# Patient Record
Sex: Male | Born: 1945 | Race: Black or African American | Hispanic: No | Marital: Married | Smoking: Never smoker
Health system: Southern US, Community
[De-identification: ages and names within clinical notes are randomized; demographics above are authoritative.]

## PROBLEM LIST (undated history)

## (undated) DIAGNOSIS — I10 Essential (primary) hypertension: Secondary | ICD-10-CM

## (undated) DIAGNOSIS — N200 Calculus of kidney: Secondary | ICD-10-CM

## (undated) DIAGNOSIS — H269 Unspecified cataract: Secondary | ICD-10-CM

## (undated) HISTORY — PX: JOINT REPLACEMENT: SHX530

## (undated) HISTORY — DX: Unspecified cataract: H26.9

## (undated) HISTORY — PX: HERNIA REPAIR: SHX51

## (undated) HISTORY — PX: KNEE SURGERY: SHX244

## (undated) HISTORY — PX: COLONOSCOPY: SHX174

## (undated) HISTORY — PX: SHOULDER OPEN ROTATOR CUFF REPAIR: SHX2407

---

## 1998-03-10 ENCOUNTER — Encounter: Admission: RE | Admit: 1998-03-10 | Discharge: 1998-03-10 | Payer: Self-pay | Admitting: Family Medicine

## 1999-11-02 ENCOUNTER — Emergency Department (HOSPITAL_COMMUNITY): Admission: EM | Admit: 1999-11-02 | Discharge: 1999-11-02 | Payer: Self-pay | Admitting: Emergency Medicine

## 1999-11-02 ENCOUNTER — Encounter: Payer: Self-pay | Admitting: Emergency Medicine

## 1999-11-04 ENCOUNTER — Emergency Department (HOSPITAL_COMMUNITY): Admission: EM | Admit: 1999-11-04 | Discharge: 1999-11-04 | Payer: Self-pay | Admitting: Emergency Medicine

## 2005-06-02 ENCOUNTER — Emergency Department (HOSPITAL_COMMUNITY): Admission: EM | Admit: 2005-06-02 | Discharge: 2005-06-02 | Payer: Self-pay | Admitting: Emergency Medicine

## 2006-06-06 ENCOUNTER — Ambulatory Visit (HOSPITAL_BASED_OUTPATIENT_CLINIC_OR_DEPARTMENT_OTHER): Admission: RE | Admit: 2006-06-06 | Discharge: 2006-06-07 | Payer: Self-pay | Admitting: Orthopaedic Surgery

## 2006-08-02 ENCOUNTER — Emergency Department (HOSPITAL_COMMUNITY): Admission: EM | Admit: 2006-08-02 | Discharge: 2006-08-02 | Payer: Self-pay | Admitting: Emergency Medicine

## 2008-03-08 ENCOUNTER — Emergency Department (HOSPITAL_COMMUNITY): Admission: EM | Admit: 2008-03-08 | Discharge: 2008-03-08 | Payer: Self-pay | Admitting: *Deleted

## 2008-03-08 ENCOUNTER — Emergency Department (HOSPITAL_COMMUNITY): Admission: EM | Admit: 2008-03-08 | Discharge: 2008-03-08 | Payer: Self-pay | Admitting: Emergency Medicine

## 2008-07-30 ENCOUNTER — Encounter (INDEPENDENT_AMBULATORY_CARE_PROVIDER_SITE_OTHER): Payer: Self-pay | Admitting: *Deleted

## 2008-11-17 ENCOUNTER — Ambulatory Visit: Payer: Self-pay | Admitting: Gastroenterology

## 2008-11-29 ENCOUNTER — Ambulatory Visit: Payer: Self-pay | Admitting: Gastroenterology
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
## 2011-02-06 LAB — URINALYSIS, ROUTINE W REFLEX MICROSCOPIC
Glucose, UA: NEGATIVE
Glucose, UA: NEGATIVE
Ketones, ur: NEGATIVE
Leukocytes, UA: NEGATIVE
Leukocytes, UA: NEGATIVE
Protein, ur: NEGATIVE
Protein, ur: NEGATIVE
Specific Gravity, Urine: 1.026
Urobilinogen, UA: 0.2
pH: 5.5

## 2011-02-06 LAB — POCT I-STAT, CHEM 8
BUN: 11
Calcium, Ion: 1.22
Hemoglobin: 15
Sodium: 140
TCO2: 27

## 2011-02-06 LAB — URINE MICROSCOPIC-ADD ON

## 2011-09-13 ENCOUNTER — Ambulatory Visit (INDEPENDENT_AMBULATORY_CARE_PROVIDER_SITE_OTHER): Payer: 59 | Admitting: Family Medicine

## 2011-09-13 ENCOUNTER — Ambulatory Visit: Payer: 59

## 2011-09-13 VITALS — BP 124/76 | HR 65 | Temp 98.1°F | Resp 16 | Ht 69.0 in | Wt 198.0 lb

## 2011-09-13 DIAGNOSIS — H6982 Other specified disorders of Eustachian tube, left ear: Secondary | ICD-10-CM

## 2011-09-13 DIAGNOSIS — M545 Low back pain, unspecified: Secondary | ICD-10-CM

## 2011-09-13 DIAGNOSIS — H6992 Unspecified Eustachian tube disorder, left ear: Secondary | ICD-10-CM

## 2011-09-13 DIAGNOSIS — N400 Enlarged prostate without lower urinary tract symptoms: Secondary | ICD-10-CM

## 2011-09-13 DIAGNOSIS — K625 Hemorrhage of anus and rectum: Secondary | ICD-10-CM

## 2011-09-13 DIAGNOSIS — Z0000 Encounter for general adult medical examination without abnormal findings: Secondary | ICD-10-CM

## 2011-09-13 LAB — POCT CBC
Lymph, poc: 2.4 (ref 0.6–3.4)
MCHC: 32 g/dL (ref 31.8–35.4)
MID (cbc): 0.4 (ref 0–0.9)
MPV: 8.7 fL (ref 0–99.8)
POC Granulocyte: 2.2 (ref 2–6.9)
POC LYMPH PERCENT: 47.8 %L (ref 10–50)
POC MID %: 8.7 %M (ref 0–12)
Platelet Count, POC: 287 10*3/uL (ref 142–424)
RDW, POC: 15.2 %

## 2011-09-13 LAB — IFOBT (OCCULT BLOOD): IFOBT: NEGATIVE

## 2011-09-13 MED ORDER — MELOXICAM 7.5 MG PO TABS: 7.5000 mg | ORAL_TABLET | Freq: Two times a day (BID) | ORAL | Status: AC

## 2011-09-13 MED ORDER — FLUTICASONE PROPIONATE 50 MCG/ACT NA SUSP: 2.0000 | Freq: Every day | NASAL | Status: DC

## 2011-09-13 MED ORDER — HYDROCORTISONE ACETATE 25 MG RE SUPP: 25.0000 mg | Freq: Two times a day (BID) | RECTAL | Status: AC

## 2011-09-13 MED ORDER — DOXAZOSIN MESYLATE 1 MG PO TABS: 1.0000 mg | ORAL_TABLET | Freq: Every day | ORAL | Status: DC

## 2011-09-13 NOTE — Progress Notes (Signed)
  Subjective:    Patient ID: Jeffrey Hull, male    DOB: [birth year], 66 y.o.   MRN: [id number]  HPI  Patient presents for CPE  LBP- complains of radiating pain to (B) buttocks and legs(R) > (L)          Occ (R) LE weakness  Internal hemorrhoids- Intermittent BRBPR and pain; using OTC Rx; Colonoscopy 2 years ago.  Nocturia one episode nightly; concerned about prostate as first cousin died of prostate cancer  Review of Systems SH/ smokeless tobacco  Otalgia (L) ear; "sinus" this time of year    Objective:   Physical Exam  Constitutional: He is oriented to person, place, and time. He appears well-developed.  HENT:  Head: Normocephalic and atraumatic.       (L) TM retracted  Eyes: EOM are normal. Pupils are equal, round, and reactive to light.  Neck: Normal range of motion. No thyromegaly present.  Cardiovascular: Normal rate, regular rhythm and normal heart sounds.   Pulmonary/Chest: Effort normal and breath sounds normal.  Abdominal: Soft. Bowel sounds are normal.       Umbilical hernia   Genitourinary:       2 + BPH Small palpable internal hemorrhoids   Musculoskeletal: Normal range of motion.  Neurological: He is alert and oriented to person, place, and time.  Skin: Skin is warm.      [facility] reading (PRIMARY) by  Dr. Hal Hope. L/S spine anterior spurring; degenerative changes L 5 - S1      Assessment & Plan:   1. Routine general medical examination at a health care facility  Comprehensive metabolic panel, Lipid panel, PSA, TSH  2. BRBPR (bright red blood per rectum)  POCT CBC, IFOBT POC (occult bld, rslt in office), hydrocortisone (ANUSOL-HC) 25 MG suppository  3. LBP (low back pain)  DG Lumbar Spine Complete, meloxicam (MOBIC) 7.5 MG tablet  4. ETD (eustachian tube dysfunction), left  fluticasone (FLONASE) 50 MCG/ACT nasal spray  5. BPH (benign prostatic hyperplasia)  doxazosin (CARDURA) 1 MG tablet    Anticipatory guidance Zostavax

## 2011-09-14 LAB — COMPREHENSIVE METABOLIC PANEL
Albumin: 4.8 g/dL (ref 3.5–5.2)
BUN: 8 mg/dL (ref 6–23)
Calcium: 9.9 mg/dL (ref 8.4–10.5)
Chloride: 106 mEq/L (ref 96–112)
Creat: 1.38 mg/dL — ABNORMAL HIGH (ref 0.50–1.35)
Glucose, Bld: 94 mg/dL (ref 70–99)
Potassium: 4.2 mEq/L (ref 3.5–5.3)

## 2011-09-14 LAB — LIPID PANEL
HDL: 49 mg/dL (ref >39)
Triglycerides: 114 mg/dL (ref <150)

## 2011-09-18 ENCOUNTER — Telehealth: Payer: Self-pay

## 2011-10-26 ENCOUNTER — Ambulatory Visit (INDEPENDENT_AMBULATORY_CARE_PROVIDER_SITE_OTHER): Payer: 59 | Admitting: Family Medicine

## 2011-10-26 VITALS — BP 129/81 | HR 71 | Temp 98.7°F | Resp 16 | Ht 69.4 in | Wt 203.4 lb

## 2011-10-26 DIAGNOSIS — K649 Unspecified hemorrhoids: Secondary | ICD-10-CM

## 2011-10-26 DIAGNOSIS — H11009 Unspecified pterygium of unspecified eye: Secondary | ICD-10-CM

## 2011-10-26 MED ORDER — HYDROCORTISONE 2.5 % RE CREA: 1.0000 "application " | TOPICAL_CREAM | Freq: Every day | RECTAL | Status: DC
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
## 2012-10-21 ENCOUNTER — Emergency Department (HOSPITAL_COMMUNITY)
Admission: EM | Admit: 2012-10-21 | Discharge: 2012-10-21 | Disposition: A | Discharge status: Home / Self Care | Payer: Medicare Other | Attending: Emergency Medicine | Admitting: Emergency Medicine

## 2012-10-21 ENCOUNTER — Encounter (HOSPITAL_COMMUNITY): Payer: Self-pay | Admitting: *Deleted

## 2012-10-21 DIAGNOSIS — Z87891 Personal history of nicotine dependence: Secondary | ICD-10-CM | POA: Insufficient documentation

## 2012-10-21 DIAGNOSIS — R110 Nausea: Secondary | ICD-10-CM | POA: Insufficient documentation

## 2012-10-21 DIAGNOSIS — N200 Calculus of kidney: Secondary | ICD-10-CM | POA: Insufficient documentation

## 2012-10-21 LAB — COMPREHENSIVE METABOLIC PANEL
AST: 33 U/L (ref 0–37)
Albumin: 3.8 g/dL (ref 3.5–5.2)
Calcium: 9 mg/dL (ref 8.4–10.5)
Chloride: 106 mEq/L (ref 96–112)
Creatinine, Ser: 1.48 mg/dL — ABNORMAL HIGH (ref 0.50–1.35)
Total Bilirubin: 0.2 mg/dL — ABNORMAL LOW (ref 0.3–1.2)

## 2012-10-21 LAB — URINE MICROSCOPIC-ADD ON

## 2012-10-21 LAB — URINALYSIS, ROUTINE W REFLEX MICROSCOPIC
Glucose, UA: NEGATIVE mg/dL
Specific Gravity, Urine: 1.019 (ref 1.005–1.030)

## 2012-10-21 LAB — CBC WITH DIFFERENTIAL/PLATELET
Basophils Absolute: 0 10*3/uL (ref 0.0–0.1)
Basophils Relative: 0 % (ref 0–1)
HCT: 41.7 % (ref 39.0–52.0)
MCHC: 33.3 g/dL (ref 30.0–36.0)
Monocytes Absolute: 0.4 10*3/uL (ref 0.1–1.0)
Neutro Abs: 1.7 10*3/uL (ref 1.7–7.7)
Neutrophils Relative %: 35 % — ABNORMAL LOW (ref 43–77)
Platelets: 212 10*3/uL (ref 150–400)
RDW: 14.7 % (ref 11.5–15.5)

## 2012-10-21 MED ORDER — SODIUM CHLORIDE 0.9 % IV BOLUS (SEPSIS)
1000.0000 mL | Freq: Once | INTRAVENOUS | Status: AC
  Administered 2012-10-21: 1000 mL via INTRAVENOUS

## 2012-10-21 MED ORDER — ONDANSETRON HCL 4 MG/2ML IJ SOLN
4.0000 mg | Freq: Once | INTRAMUSCULAR | Status: AC
  Administered 2012-10-21: 4 mg via INTRAVENOUS
  Filled 2012-10-21: qty 2

## 2012-10-21 MED ORDER — ONDANSETRON 4 MG PO TBDP: 4.0000 mg | ORAL_TABLET | Freq: Three times a day (TID) | ORAL | Status: DC | PRN

## 2012-10-21 MED ORDER — OXYCODONE-ACETAMINOPHEN 5-325 MG PO TABS: 2.0000 | ORAL_TABLET | ORAL | Status: DC | PRN

## 2012-10-21 MED ORDER — TAMSULOSIN HCL 0.4 MG PO CAPS
0.4000 mg | ORAL_CAPSULE | Freq: Once | ORAL | Status: AC
  Administered 2012-10-21: 0.4 mg via ORAL
  Filled 2012-10-21: qty 1

## 2012-10-21 MED ORDER — HYDROMORPHONE HCL PF 1 MG/ML IJ SOLN
1.0000 mg | Freq: Once | INTRAMUSCULAR | Status: AC
  Administered 2012-10-21: 1 mg via INTRAVENOUS
  Filled 2012-10-21: qty 1

## 2012-10-21 NOTE — Discharge Instructions (Signed)
Take Percocet as needed for pain. Take Zofran as needed for nausea. Refer to attached documents for more information. Follow up with Urologist as needed.

## 2012-10-21 NOTE — ED Provider Notes (Signed)
Medical screening examination/treatment/procedure(s) were conducted as a shared visit with non-physician practitioner(s) and myself.  I personally evaluated the patient during the encounter  Pt with history of renal stones has flank pain similar to previous. Renal function at baseline. No UTI. Pain controlled. Avoid CT for now, advised to return if pain worsens or if he does not pass stone in the next few days. Urology referral.   Bonnita Levan. Bernette Mayers, MD 10/21/12 [phone]

## 2012-10-21 NOTE — ED Notes (Signed)
Bilateral flank pain; no painful urination; hx. Of kidney stones.  Intermittent periods of nausea.

## 2012-10-21 NOTE — ED Provider Notes (Signed)
History     CSN: [id number]  Arrival date & time 10/21/12  7829   None     Chief Complaint  Patient presents with  . Flank Pain    (Consider location/radiation/quality/duration/timing/severity/associated sxs/prior treatment) HPI Comments: Patient is a 67 year old male with a past medical history of kidney stones who presents with bilateral flank pain that started earlier this morning. The pain is located in his bilateral flanks and radiates around to his lower abdomen. The pain is described as sharp and severe. The pain started gradually and progressively worsened since the onset. No alleviating/aggravating factors. The patient has tried nothing for symptoms without relief. Associated symptoms include nausea. Patient denies fever, headache, NVD, chest pain, SOB, dysuria, constipation. Patient reports sudden relief of his pain a few minutes prior to me interviewing him.    History reviewed. No pertinent past medical history.  History reviewed. No pertinent past surgical history.  No family history on file.  History  Substance Use Topics  . Smoking status: Former Games developer  . Smokeless tobacco: Current User  . Alcohol Use: Not on file      Review of Systems  Gastrointestinal: Positive for nausea.  Genitourinary: Positive for flank pain.  All other systems reviewed and are negative.    Allergies  Review of patient's allergies indicates no known allergies.  Home Medications   Current Outpatient Rx  Name  Route  Sig  Dispense  Refill  . EXPIRED: doxazosin (CARDURA) 1 MG tablet   Oral   Take 1 tablet (1 mg total) by mouth at bedtime.   30 tablet   3   . EXPIRED: fluticasone (FLONASE) 50 MCG/ACT nasal spray   Nasal   Place 2 sprays into the nose daily.   16 g   6   . hydrocortisone (PROCTOSOL HC) 2.5 % rectal cream   Rectal   Place 1 application rectally daily.   30 g   3     BP 157/119  Pulse 82  Temp(Src) 98.4 F (36.9 C) (Oral)  Resp 20  SpO2  100%  Physical Exam  Nursing note and vitals reviewed. Constitutional: He is oriented to person, place, and time. He appears well-developed and well-nourished. No distress.  HENT:  Head: Normocephalic and atraumatic.  Eyes: Conjunctivae and EOM are normal.  Neck: Normal range of motion.  Cardiovascular: Normal rate and regular rhythm.  Exam reveals no gallop and no friction rub.   No murmur heard. Pulmonary/Chest: Effort normal and breath sounds normal. He has no wheezes. He has no rales. He exhibits no tenderness.  Abdominal: Soft. He exhibits no distension. There is no tenderness. There is no rebound and no guarding.  Genitourinary:  No CVA tenderness.   Musculoskeletal: Normal range of motion.  Neurological: He is alert and oriented to person, place, and time. Coordination normal.  Speech is goal-oriented. Moves limbs without ataxia.   Skin: Skin is warm and dry.  Psychiatric: He has a normal mood and affect. His behavior is normal.    ED Course  Procedures (including critical care time)  Labs Reviewed  CBC WITH DIFFERENTIAL - Abnormal; Notable for the following:    Neutrophils Relative % 35 (*)    Lymphocytes Relative 52 (*)    All other components within normal limits  COMPREHENSIVE METABOLIC PANEL - Abnormal; Notable for the following:    Glucose, Bld 129 (*)    Creatinine, Ser 1.48 (*)    Total Bilirubin 0.2 (*)    GFR calc  non Af Amer 48 (*)    GFR calc Af Amer 56 (*)    All other components within normal limits  URINALYSIS, ROUTINE W REFLEX MICROSCOPIC - Abnormal; Notable for the following:    Hgb urine dipstick LARGE (*)    All other components within normal limits  URINE MICROSCOPIC-ADD ON - Abnormal; Notable for the following:    Crystals CA OXALATE CRYSTALS (*)    All other components within normal limits   No results found.   1. Kidney stone       MDM  6:05 AM Labs pending. Patient denies pain medication at this time due to sudden relief of pain.    8:55 AM Labs unremarkable for acute changes. Urinalysis consistent with kidney stone. Patient will not have CT scan due to his familiarity with kidney stones. Patient reports asymptomatic at the time. I believe the stone has dropped into his bladder at this point. I will discharge the patient with Percocet as Zofran for recurring symptoms as needed. Vitals stable and patient afebrile. Patient instructed to follow up with Urology as needed.       Emilia Beck, [place] 10/21/12 [phone]

## 2012-11-25 ENCOUNTER — Ambulatory Visit (INDEPENDENT_AMBULATORY_CARE_PROVIDER_SITE_OTHER): Payer: Medicare Other | Admitting: Family Medicine

## 2012-11-25 VITALS — BP 118/84 | HR 84 | Temp 98.0°F | Resp 17 | Ht 69.0 in | Wt 203.0 lb

## 2012-11-25 DIAGNOSIS — Z139 Encounter for screening, unspecified: Secondary | ICD-10-CM

## 2012-11-25 DIAGNOSIS — L729 Follicular cyst of the skin and subcutaneous tissue, unspecified: Secondary | ICD-10-CM

## 2012-11-25 DIAGNOSIS — Z0000 Encounter for general adult medical examination without abnormal findings: Secondary | ICD-10-CM

## 2012-11-25 DIAGNOSIS — L723 Sebaceous cyst: Secondary | ICD-10-CM

## 2012-11-25 DIAGNOSIS — Z23 Encounter for immunization: Secondary | ICD-10-CM

## 2012-11-25 LAB — POCT URINALYSIS DIPSTICK
Bilirubin, UA: NEGATIVE
Ketones, UA: NEGATIVE
Leukocytes, UA: NEGATIVE

## 2012-11-25 LAB — IFOBT (OCCULT BLOOD): IFOBT: NEGATIVE

## 2012-11-25 LAB — POCT UA - MICROSCOPIC ONLY
Bacteria, U Microscopic: NEGATIVE
RBC, urine, microscopic: NEGATIVE
WBC, Ur, HPF, POC: NEGATIVE

## 2012-11-25 MED ORDER — MUPIROCIN 2 % EX OINT: TOPICAL_OINTMENT | Freq: Three times a day (TID) | CUTANEOUS | Status: DC

## 2012-11-25 NOTE — Patient Instructions (Addendum)
Remember to go to get your shingles vaccine from the pharmacy. You will be due for your colonoscopy next summer. Apply heat to the area for 10 to 15 min 3 times a day and then apply Bactroban. Return to clinic if it comes back or gets worse.  Keeping you healthy  Get these tests  Blood pressure- Have your blood pressure checked once a year by your healthcare provider.  Normal blood pressure is 120/80  Weight- Have your body mass index (BMI) calculated to screen for obesity.  BMI is a measure of body fat based on height and weight. You can also calculate your own BMI at [email or web address].  Cholesterol- Have your cholesterol checked every year.  Diabetes- Have your blood sugar checked regularly if you have high blood pressure, high cholesterol, have a family history of diabetes or if you are overweight.  Screening for Colon Cancer- Colonoscopy starting at age 52.  Screening may begin sooner depending on your family history and other health conditions. Follow up colonoscopy as directed by your Gastroenterologist.  Screening for Prostate Cancer- Both blood work (PSA) and a rectal exam help screen for Prostate Cancer.  Screening begins at age 56 with African-American men and at age 25 with Caucasian men.  Screening may begin sooner depending on your family history.  Take these medicines  Aspirin- One aspirin daily can help prevent Heart disease and Stroke.  Flu shot- Every fall.  Tetanus- Every 10 years.  Zostavax- Once after the age of 11 to prevent Shingles.  Pneumonia shot- Once after the age of 53; if you are younger than 41, ask your healthcare provider if you need a Pneumonia shot.  Take these steps  Don't smoke- If you do smoke, talk to your doctor about quitting.  For tips on how to quit, go to www.smokefree.gov or call 1-800-QUIT-NOW.  Be physically active- Exercise 5 days a week for at least 30 minutes.  If you are not already physically active start slow and gradually  work up to 30 minutes of moderate physical activity.  Examples of moderate activity include walking briskly, mowing the yard, dancing, swimming, bicycling, etc.  Eat a healthy diet- Eat a variety of healthy food such as fruits, vegetables, low fat milk, low fat cheese, yogurt, lean meant, poultry, fish, beans, tofu, etc. For more information go to www.thenutritionsource.org  Drink alcohol in moderation- Limit alcohol intake to less than two drinks a day. Never drink and drive.  Dentist- Brush and floss twice daily; visit your dentist twice a year.  Depression- Your emotional health is as important as your physical health. If you're feeling down, or losing interest in things you would normally enjoy please talk to your healthcare provider.  Eye exam- Visit your eye doctor every year.  Safe sex- If you may be exposed to a sexually transmitted infection, use a condom.  Seat belts- Seat belts can save your life; always wear one.  Smoke/Carbon Monoxide detectors- These detectors need to be installed on the appropriate level of your home.  Replace batteries at least once a year.  Skin cancer- When out in the sun, cover up and use sunscreen 15 SPF or higher.  Violence- If anyone is threatening you, please tell your healthcare provider.  Living Will/ Health care power of attorney- Speak with your healthcare provider and family.

## 2012-11-25 NOTE — Progress Notes (Signed)
Subjective:    Jeffrey Hull is a 67 y.o. male who presents for a welcome to Medicare exam.   Cardiac risk factors: advanced age (older than 54 for men, 5 for women), male gender and smoking/ tobacco exposure.  Depression Screen (Note: if answer to either of the following is "Yes", a more complete depression screening is indicated)  Q1: Over the past two weeks, have you felt down, depressed or hopeless? no Q2: Over the past two weeks, have you felt little interest or pleasure in doing things? no  Activities of Daily Living In your present state of health, do you have any difficulty performing the following activities?:  Preparing food and eating?: No Bathing yourself: No Getting dressed: No Using the toilet:No Moving around from place to place: No In the past year have you fallen or had a near fall?:No  Current exercise habits: Home exercise routine includes walking 1 mi 3-4x/wk, basketball hrs per week.  Dietary issues discussed: no certain diet, does supplement with fish oil and garlic  Hearing difficulties: No - but pt does not perceive any but his wife does Safe in current home environment: yes  The following portions of the patient's history were reviewed and updated as appropriate: allergies, current medications, past family history, past medical history, past social history, past surgical history and problem list. Review of Systems Pertinent items are noted in HPI.    Objective:     Vision by Snellen chart: right eye:20/30, left eye:20/40 - just had his eyes examined but has not gotten the new glasses rx filled yet. Blood pressure 118/84, pulse 84, temperature 98 F (36.7 C), temperature source Oral, resp. rate 17, height 5\' 9"  (1.753 m), weight 203 lb (92.08 kg), SpO2 96.00%. Body mass index is 29.96 kg/(m^2). BP 118/84  Pulse 84  Temp(Src) 98 F (36.7 C) (Oral)  Resp 17  Ht 5\' 9"  (1.753 m)  Wt 203 lb (92.08 kg)  BMI 29.96 kg/m2  SpO2 96%  General Appearance:     Alert, cooperative, no distress, appears stated age  Head:    Normocephalic, without obvious abnormality, atraumatic  Eyes:    PERRL, conjunctiva/corneas clear, EOM's intact, fundi    benign, both eyes       Ears:    Normal TM's and external ear canals, both ears  Nose:   Nares normal, septum midline, mucosa normal, no drainage    or sinus tenderness  Throat:   Lips, mucosa, and tongue normal; teeth and gums normal  Neck:   Supple, symmetrical, trachea midline, no adenopathy;       thyroid:  No enlargement/tenderness/nodules; no carotid   bruit or JVD  Back:     Symmetric, no curvature, ROM normal, no CVA tenderness  Lungs:     Clear to auscultation bilaterally, respirations unlabored  Chest wall:    No tenderness or deformity  Heart:    Regular rate and rhythm, S1 and S2 normal, no murmur, rub   or gallop  Abdomen:     Soft, non-tender, bowel sounds active all four quadrants,    no masses, no organomegaly  Genitalia:    Normal male without lesion, discharge or tenderness  Rectal:    Normal tone, normal prostate, no masses or tenderness;   guaiac negative stool  Extremities:   Extremities normal, atraumatic, no cyanosis or edema  Pulses:   2+ and symmetric all extremities  Skin:   Skin color, texture, turgor normal, no rashes or lesions  Lymph nodes:  Cervical, supraclavicular, and axillary nodes normal  Neurologic:   CNII-XII intact. Normal strength, sensation and reflexes      throughout      Assessment:     Routine general medical examination at a health care facility - Plan: POCT UA - Microscopic Only, POCT urinalysis dipstick, Basic metabolic panel, PSA, Medicare, IFOBT POC (occult bld, rslt in office)  Need for prophylactic vaccination against Streptococcus pneumoniae (pneumococcus) - Plan: Pneumococcal polysaccharide vaccine 23-valent greater than or equal to 2yo subcutaneous/IM        Plan:     During the course of the visit the patient was educated and counseled  about appropriate screening and preventive services including:   Pneumococcal vaccine   Td vaccine - done 2008  Prostate cancer screening  Colorectal cancer screening - due next yr  Diabetes screening  Glaucoma screening  Zoster Vaccine  Patient Instructions (the written plan) was given to the patient.

## 2012-11-26 ENCOUNTER — Encounter: Payer: Self-pay | Admitting: Family Medicine

## 2012-11-26 LAB — BASIC METABOLIC PANEL
CO2: 28 mEq/L (ref 19–32)
Calcium: 10.1 mg/dL (ref 8.4–10.5)
Chloride: 104 mEq/L (ref 96–112)
Sodium: 139 mEq/L (ref 135–145)

## 2012-11-26 MED ORDER — MUPIROCIN 2 % EX OINT: TOPICAL_OINTMENT | Freq: Three times a day (TID) | CUTANEOUS | Status: DC

## 2012-11-26 NOTE — Progress Notes (Signed)
  Subjective:    Patient ID: Jeffrey Hull, male    DOB: 1945-12-17, 67 y.o.   MRN: [id number] Chief Complaint  Patient presents with  . Annual Exam    physical for medicare     HPI  [name] is concerned about a small bump on his scrotum. Has been there for over 6 months and is itchy. Had a bird's nest build in the a.c. In his bedroom and actually had bird mites blown into the room - had to replace everything - so he is worried that that is what has caused the bump.  Not painful, not draining anything, not increasing in size. Has put occ otc ointments on it - like A&D - but never helped.  Past Medical History  Diagnosis Date  . Arthritis    No current outpatient prescriptions on file prior to visit.   No current facility-administered medications on file prior to visit.   No Known Allergies   Review of Systems  Constitutional: Negative for fever, chills, activity change and appetite change.  HENT: Positive for tinnitus.   Cardiovascular: Negative for leg swelling.  Gastrointestinal: Negative for nausea, vomiting, abdominal pain, diarrhea and constipation.  Musculoskeletal: Negative for myalgias, joint swelling and gait problem.  Skin: Positive for color change and rash.  Neurological: Negative for weakness and numbness.  Hematological: Negative for adenopathy. Does not bruise/bleed easily.  Psychiatric/Behavioral: Negative for sleep disturbance and dysphoric mood. The patient is not nervous/anxious.       BP 118/84  Pulse 84  Temp(Src) 98 F (36.7 C) (Oral)  Resp 17  Ht 5\' 9"  (1.753 m)  Wt 203 lb (92.08 kg)  BMI 29.96 kg/m2  SpO2 96% Objective:   Physical Exam  Constitutional: He is oriented to person, place, and time. He appears well-developed and well-nourished. No distress.  HENT:  Head: Normocephalic and atraumatic.  Eyes: Conjunctivae are normal. Pupils are equal, round, and reactive to light. No scleral icterus.  Neck: Normal range of motion. Neck supple. No  thyromegaly present.  Cardiovascular: Normal rate, regular rhythm, normal heart sounds and intact distal pulses.   Pulmonary/Chest: Effort normal and breath sounds normal. No respiratory distress.  Musculoskeletal: He exhibits no edema.  Lymphadenopathy:    He has no cervical adenopathy.  Neurological: He is alert and oriented to person, place, and time.  Skin: Skin is warm and dry. He is not diaphoretic.  Pinpoint white papule on left anterior scrotum, non-tender.  Psychiatric: He has a normal mood and affect. His behavior is normal.      Assessment & Plan:  Follicular cyst of skin - pinpoint lesion opened with 11-blade after cleaning with alcohol. No drainage. No EBL. Not painful, pt tolerated procedure well. Rec tid hot compresses and top bactroban until skin returns to normal. RTC immed for any increased pain, redness, swelling, or purulent drainage.  Meds ordered this encounter  Medications  . DISCONTD: mupirocin ointment (BACTROBAN) 2 %    Sig: Apply topically 3 (three) times daily.    Dispense:  30 g    Refill:  0  . mupirocin ointment (BACTROBAN) 2 %    Sig: Apply topically 3 (three) times daily.    Dispense:  30 g    Refill:  0

## 2013-09-08 ENCOUNTER — Encounter: Payer: Self-pay | Admitting: Gastroenterology

## 2013-09-15 ENCOUNTER — Encounter: Payer: Self-pay | Admitting: Gastroenterology

## 2013-09-27 ENCOUNTER — Ambulatory Visit (INDEPENDENT_AMBULATORY_CARE_PROVIDER_SITE_OTHER): Payer: Medicare Other | Admitting: Emergency Medicine

## 2013-09-27 ENCOUNTER — Ambulatory Visit: Payer: Medicare Other

## 2013-09-27 VITALS — BP 120/74 | HR 70 | Temp 98.6°F | Resp 16 | Ht 69.0 in | Wt 204.0 lb

## 2013-09-27 DIAGNOSIS — R319 Hematuria, unspecified: Secondary | ICD-10-CM

## 2013-09-27 DIAGNOSIS — R350 Frequency of micturition: Secondary | ICD-10-CM

## 2013-09-27 DIAGNOSIS — N400 Enlarged prostate without lower urinary tract symptoms: Secondary | ICD-10-CM

## 2013-09-27 LAB — POCT URINALYSIS DIPSTICK
BILIRUBIN UA: NEGATIVE
GLUCOSE UA: NEGATIVE
KETONES UA: NEGATIVE
LEUKOCYTES UA: NEGATIVE
Nitrite, UA: NEGATIVE
PROTEIN UA: NEGATIVE
SPEC GRAV UA: 1.02
Urobilinogen, UA: 0.2
pH, UA: 6

## 2013-09-27 LAB — POCT CBC
Granulocyte percent: 48.3 %G (ref 37–80)
HCT, POC: 42.5 % — AB (ref 43.5–53.7)
Hemoglobin: 13.5 g/dL — AB (ref 14.1–18.1)
LYMPH, POC: 1.8 (ref 0.6–3.4)
MCH: 26.6 pg — AB (ref 27–31.2)
MCHC: 31.8 g/dL (ref 31.8–35.4)
MCV: 83.8 fL (ref 80–97)
MID (CBC): 0.4 (ref 0–0.9)
MPV: 8.7 fL (ref 0–99.8)
PLATELET COUNT, POC: 254 10*3/uL (ref 142–424)
POC Granulocyte: 2.1 (ref 2–6.9)
POC LYMPH %: 41.9 % (ref 10–50)
POC MID %: 9.8 % (ref 0–12)
RBC: 5.07 M/uL (ref 4.69–6.13)
RDW, POC: 15.6 %
WBC: 4.3 10*3/uL — AB (ref 4.6–10.2)

## 2013-09-27 LAB — POCT UA - MICROSCOPIC ONLY
CRYSTALS, UR, HPF, POC: NEGATIVE
Casts, Ur, LPF, POC: NEGATIVE
Epithelial cells, urine per micros: NEGATIVE
Mucus, UA: POSITIVE
Yeast, UA: NEGATIVE

## 2013-09-27 MED ORDER — TAMSULOSIN HCL 0.4 MG PO CAPS: 0.4000 mg | ORAL_CAPSULE | Freq: Every day | ORAL | Status: DC

## 2013-09-27 NOTE — Progress Notes (Signed)
   Subjective:    Patient ID: Jeffrey Hull, male    DOB: [birth year], 68 y.o.   MRN: [id number]  HPI Pt feels for 2 weeks now like his bladder is sore, weak. He feels like his bladder is not emptying totally. He is having a burning sensation with urination. He feels like the pressure is not good on his urine. He admits to more nocturia. He does not take any meds. He usually has his prostate checked once per year. He also states he has had left side/flank pain. He is going for colonoscopy next month.    Review of Systems     Objective:   Physical Exam patient is alert and cooperative. Neck supple chest clear heart regular no murmurs abdomen soft nontender genital exam is normal prostate is somewhat enlarged but nontender   Results for orders placed in visit on 09/27/13  POCT UA - MICROSCOPIC ONLY      Result Value Ref Range   WBC, Ur, HPF, POC 0-1     RBC, urine, microscopic 10-13     Bacteria, U Microscopic 2+     Mucus, UA pos     Epithelial cells, urine per micros neg     Crystals, Ur, HPF, POC neg     Casts, Ur, LPF, POC neg     Yeast, UA neg    POCT URINALYSIS DIPSTICK      Result Value Ref Range   Color, UA yellow     Clarity, UA clear     Glucose, UA neg     Bilirubin, UA neg     Ketones, UA neg     Spec Grav, UA 1.020     Blood, UA trace     pH, UA 6.0     Protein, UA neg     Urobilinogen, UA 0.2     Nitrite, UA neg     Leukocytes, UA Negative    POCT CBC      Result Value Ref Range   WBC 4.3 (*) 4.6 - 10.2 K/uL   Lymph, poc 1.8  0.6 - 3.4   POC LYMPH PERCENT 41.9  10 - 50 %L   MID (cbc) 0.4  0 - 0.9   POC MID % 9.8  0 - 12 %M   POC Granulocyte 2.1  2 - 6.9   Granulocyte percent 48.3  37 - 80 %G   RBC 5.07  4.69 - 6.13 M/uL   Hemoglobin 13.5 (*) 14.1 - 18.1 g/dL   HCT, POC 42.5 (*) 43.5 - 53.7 %   MCV 83.8  80 - 97 fL   MCH, POC 26.6 (*) 27 - 31.2 pg   MCHC 31.8  31.8 - 35.4 g/dL   RDW, POC 15.6     Platelet Count, POC 254  142 - 424 K/uL   MPV 8.7  0 -  99.8 fL   [facility] reading (PRIMARY) by  Dr.Daub there is a questionable 5 mm density at the left UVJ that is not truly calcified.      Assessment & Plan:  Patient presents with difficulty voiding urinary urgency. Question whether this is stone related. He is scheduled for a colonoscopy next month. Referral made to the urologist for their evaluation. He was given Flomax to take 1 a day along with a strainer for his urine.

## 2013-09-27 NOTE — Patient Instructions (Signed)
Benign Prostatic Hyperplasia An enlarged prostate (benign prostatic hyperplasia) is common in older men. You may experience the following:  Weak urine stream.  Dribbling.  Feeling like the bladder has not emptied completely.  Difficulty starting urination.  Getting up frequently at night to urinate.  Urinating more frequently during the day. HOME CARE INSTRUCTIONS  Monitor your prostatic hyperplasia for any changes. The following actions may help to alleviate any discomfort you are experiencing:  Give yourself time when you urinate.  Stay away from alcohol.  Avoid beverages containing caffeine, such as coffee, tea, and colas, because they can make the problem worse.  Avoid decongestants, antihistamines, and some prescription medicines that can make the problem worse.  Follow up with your health care provider for further treatment as recommended. SEEK MEDICAL CARE IF:  You are experiencing progressive difficulty voiding.  Your urine stream is progressively getting narrower.  You are awaking from sleep with the urge to void more frequently.  You are constantly feeling the need to void.  You experience loss of urine, especially in small amounts. SEEK IMMEDIATE MEDICAL CARE IF:   You develop increased pain with urination or are unable to urinate.  You develop severe abdominal pain, vomiting, a high fever, or fainting.  You develop back pain or blood in your urine. MAKE SURE YOU:   Understand these instructions.  Will watch your condition.  Will get help right away if you are not doing well or get worse. Document Released: 04/23/2005 Document Revised: 12/24/2012 Document Reviewed: 09/23/2012 [facility] Patient Information 2014 [place]. Hematuria, Adult Hematuria is blood in your urine. It can be caused by a bladder infection, kidney infection, prostate infection, kidney stone, or cancer of your urinary tract. Infections can usually be treated with medicine, and  a kidney stone usually will pass through your urine. If neither of these is the cause of your hematuria, further workup to find out the reason may be needed. It is very important that you tell your health care provider about any blood you see in your urine, even if the blood stops without treatment or happens without causing pain. Blood in your urine that happens and then stops and then happens again can be a symptom of a very serious condition. Also, pain is not a symptom in the initial stages of many urinary cancers. HOME CARE INSTRUCTIONS   Drink lots of fluid, 3 4 quarts a day. If you have been diagnosed with an infection, cranberry juice is especially recommended, in addition to large amounts of water.  Avoid caffeine, tea, and carbonated beverages, because they tend to irritate the bladder.  Avoid alcohol because it may irritate the prostate.  Only take over-the-counter or prescription medicines for pain, discomfort, or fever as directed by your health care provider.  If you have been diagnosed with a kidney stone, follow your health care provider's instructions regarding straining your urine to catch the stone.  Empty your bladder often. Avoid holding urine for long periods of time.  After a bowel movement, women should cleanse front to back. Use each tissue only once.  Empty your bladder before and after sexual intercourse if you are a male. SEEK MEDICAL CARE IF: You develop back pain, fever, a feeling of sickness in your stomach (nausea), or vomiting or if your symptoms are not better in 3 days. Return sooner if you are getting worse. SEEK IMMEDIATE MEDICAL CARE IF:   You have a persistent fever, with a temperature of 101.72F (38.8C) or greater.  You develop severe vomiting and are unable to keep the medicine down.  You develop severe back or abdominal pain despite taking your medicines.  You begin passing a large amount of blood or clots in your urine.  You feel extremely  weak or faint, or you pass out. MAKE SURE YOU:   Understand these instructions.  Will watch your condition.  Will get help right away if you are not doing well or get worse. Document Released: 04/23/2005 Document Revised: 02/11/2013 Document Reviewed: 12/22/2012 [facility] Patient Information 2014 [place].

## 2013-09-28 ENCOUNTER — Encounter (HOSPITAL_COMMUNITY): Payer: Self-pay | Admitting: Emergency Medicine

## 2013-09-28 ENCOUNTER — Emergency Department (HOSPITAL_COMMUNITY)
Admission: EM | Admit: 2013-09-28 | Discharge: 2013-09-28 | Disposition: A | Discharge status: Home / Self Care | Payer: Medicare Other | Attending: Emergency Medicine | Admitting: Emergency Medicine

## 2013-09-28 DIAGNOSIS — Z79899 Other long term (current) drug therapy: Secondary | ICD-10-CM | POA: Insufficient documentation

## 2013-09-28 DIAGNOSIS — Z87442 Personal history of urinary calculi: Secondary | ICD-10-CM | POA: Insufficient documentation

## 2013-09-28 DIAGNOSIS — Z87891 Personal history of nicotine dependence: Secondary | ICD-10-CM | POA: Insufficient documentation

## 2013-09-28 DIAGNOSIS — N23 Unspecified renal colic: Secondary | ICD-10-CM | POA: Insufficient documentation

## 2013-09-28 DIAGNOSIS — Z8739 Personal history of other diseases of the musculoskeletal system and connective tissue: Secondary | ICD-10-CM | POA: Insufficient documentation

## 2013-09-28 HISTORY — DX: Calculus of kidney: N20.0

## 2013-09-28 LAB — CBC WITH DIFFERENTIAL/PLATELET
BASOS ABS: 0 10*3/uL (ref 0.0–0.1)
BASOS PCT: 0 % (ref 0–1)
EOS ABS: 0.3 10*3/uL (ref 0.0–0.7)
EOS PCT: 6 % — AB (ref 0–5)
HCT: 41.8 % (ref 39.0–52.0)
Hemoglobin: 13.5 g/dL (ref 13.0–17.0)
Lymphocytes Relative: 43 % (ref 12–46)
Lymphs Abs: 2.3 10*3/uL (ref 0.7–4.0)
MCH: 26.5 pg (ref 26.0–34.0)
MCHC: 32.3 g/dL (ref 30.0–36.0)
MCV: 82.1 fL (ref 78.0–100.0)
Monocytes Absolute: 0.5 10*3/uL (ref 0.1–1.0)
Monocytes Relative: 9 % (ref 3–12)
Neutro Abs: 2.2 10*3/uL (ref 1.7–7.7)
Neutrophils Relative %: 42 % — ABNORMAL LOW (ref 43–77)
Platelets: 234 10*3/uL (ref 150–400)
RBC: 5.09 MIL/uL (ref 4.22–5.81)
RDW: 14.6 % (ref 11.5–15.5)
WBC: 5.4 10*3/uL (ref 4.0–10.5)

## 2013-09-28 LAB — COMPREHENSIVE METABOLIC PANEL
ALBUMIN: 3.9 g/dL (ref 3.5–5.2)
ALT: 21 U/L (ref 0–53)
AST: 31 U/L (ref 0–37)
Alkaline Phosphatase: 76 U/L (ref 39–117)
BUN: 8 mg/dL (ref 6–23)
CALCIUM: 9.1 mg/dL (ref 8.4–10.5)
CO2: 23 mEq/L (ref 19–32)
Chloride: 103 mEq/L (ref 96–112)
Creatinine, Ser: 1.41 mg/dL — ABNORMAL HIGH (ref 0.50–1.35)
GFR calc Af Amer: 58 mL/min — ABNORMAL LOW (ref >90)
GFR calc non Af Amer: 50 mL/min — ABNORMAL LOW (ref >90)
GLUCOSE: 124 mg/dL — AB (ref 70–99)
POTASSIUM: 3.5 meq/L — AB (ref 3.7–5.3)
SODIUM: 138 meq/L (ref 137–147)
TOTAL PROTEIN: 7.2 g/dL (ref 6.0–8.3)
Total Bilirubin: <0.2 mg/dL — ABNORMAL LOW (ref 0.3–1.2)

## 2013-09-28 LAB — URINE CULTURE
Colony Count: NO GROWTH
Organism ID, Bacteria: NO GROWTH

## 2013-09-28 LAB — URINALYSIS, ROUTINE W REFLEX MICROSCOPIC
Bilirubin Urine: NEGATIVE
Glucose, UA: NEGATIVE mg/dL
Ketones, ur: NEGATIVE mg/dL
Leukocytes, UA: NEGATIVE
Nitrite: NEGATIVE
PH: 5.5 (ref 5.0–8.0)
Protein, ur: NEGATIVE mg/dL
SPECIFIC GRAVITY, URINE: 1.01 (ref 1.005–1.030)
UROBILINOGEN UA: 0.2 mg/dL (ref 0.0–1.0)

## 2013-09-28 LAB — LIPASE, BLOOD: Lipase: 62 U/L — ABNORMAL HIGH (ref 11–59)

## 2013-09-28 LAB — URINE MICROSCOPIC-ADD ON

## 2013-09-28 LAB — PSA, MEDICARE: PSA: 1.35 ng/mL (ref <=4.00)

## 2013-09-28 MED ORDER — ONDANSETRON HCL 4 MG PO TABS: 4.0000 mg | ORAL_TABLET | Freq: Three times a day (TID) | ORAL | Status: DC | PRN

## 2013-09-28 MED ORDER — OXYCODONE-ACETAMINOPHEN 5-325 MG PO TABS: 1.0000 | ORAL_TABLET | ORAL | Status: DC | PRN

## 2013-09-28 MED ORDER — DOCUSATE SODIUM 100 MG PO CAPS: 100.0000 mg | ORAL_CAPSULE | Freq: Two times a day (BID) | ORAL | Status: DC

## 2013-09-28 NOTE — ED Notes (Signed)
MD at bedside. 

## 2013-09-28 NOTE — ED Notes (Signed)
Pt here for flank pain, hx of kidney stones, sts dysuria, urinary frequency and urgency

## 2013-09-28 NOTE — ED Notes (Signed)
Post residual bladder, 12-20 ml after voiding approx 200 ml

## 2013-09-28 NOTE — Discharge Instructions (Signed)
Kidney Stones  Kidney stones (urolithiasis) are deposits that form inside your kidneys. The intense pain is caused by the stone moving through the urinary tract. When the stone moves, the ureter goes into spasm around the stone. The stone is usually passed in the urine.   CAUSES   · A disorder that makes certain neck glands produce too much parathyroid hormone (primary hyperparathyroidism).  · A buildup of uric acid crystals, similar to gout in your joints.  · Narrowing (stricture) of the ureter.  · A kidney obstruction present at birth (congenital obstruction).  · Previous surgery on the kidney or ureters.  · Numerous kidney infections.  SYMPTOMS   · Feeling sick to your stomach (nauseous).  · Throwing up (vomiting).  · Blood in the urine (hematuria).  · Pain that usually spreads (radiates) to the groin.  · Frequency or urgency of urination.  DIAGNOSIS   · Taking a history and physical exam.  · Blood or urine tests.  · CT scan.  · Occasionally, an examination of the inside of the urinary bladder (cystoscopy) is performed.  TREATMENT   · Observation.  · Increasing your fluid intake.  · Extracorporeal shock wave lithotripsy This is a noninvasive procedure that uses shock waves to break up kidney stones.  · Surgery may be needed if you have severe pain or persistent obstruction. There are various surgical procedures. Most of the procedures are performed with the use of small instruments. Only small incisions are needed to accommodate these instruments, so recovery time is minimized.  The size, location, and chemical composition are all important variables that will determine the proper choice of action for you. Talk to your health care provider to better understand your situation so that you will minimize the risk of injury to yourself and your kidney.   HOME CARE INSTRUCTIONS   · Drink enough water and fluids to keep your urine clear or pale yellow. This will help you to pass the stone or stone fragments.  · Strain  all urine through the provided strainer. Keep all particulate matter and stones for your health care provider to see. The stone causing the pain may be as small as a grain of salt. It is very important to use the strainer each and every time you pass your urine. The collection of your stone will allow your health care provider to analyze it and verify that a stone has actually passed. The stone analysis will often identify what you can do to reduce the incidence of recurrences.  · Only take over-the-counter or prescription medicines for pain, discomfort, or fever as directed by your health care provider.  · Make a follow-up appointment with your health care provider as directed.  · Get follow-up X-rays if required. The absence of pain does not always mean that the stone has passed. It may have only stopped moving. If the urine remains completely obstructed, it can cause loss of kidney function or even complete destruction of the kidney. It is your responsibility to make sure X-rays and follow-ups are completed. Ultrasounds of the kidney can show blockages and the status of the kidney. Ultrasounds are not associated with any radiation and can be performed easily in a matter of minutes.  SEEK MEDICAL CARE IF:  · You experience pain that is progressive and unresponsive to any pain medicine you have been prescribed.  SEEK IMMEDIATE MEDICAL CARE IF:   · Pain cannot be controlled with the prescribed medicine.  · You have a fever   or shaking chills.  · The severity or intensity of pain increases over 18 hours and is not relieved by pain medicine.  · You develop a new onset of abdominal pain.  · You feel faint or pass out.  · You are unable to urinate.  MAKE SURE YOU:   · Understand these instructions.  · Will watch your condition.  · Will get help right away if you are not doing well or get worse.  Document Released: 04/23/2005 Document Revised: 12/24/2012 Document Reviewed: 09/24/2012  [employer]® Patient Information ©2014  [employer].

## 2013-09-28 NOTE — ED Provider Notes (Signed)
CSN: [id number]     Arrival date & time 09/28/13  0020 History   First MD Initiated Contact with Patient 09/28/13 0451     Chief Complaint  Patient presents with  . Abdominal Pain  . Flank Pain     (Consider location/radiation/quality/duration/timing/severity/associated sxs/prior Treatment) HPI  This patient is a 68 yo man with a history of renal colic. He presents with left flank pain radiating to the LLQ and the left groin. Began yesterday and was more severe at that time. Patient says he was seen at an urgent care clinic yesterday and told he did not have a stone. He continues to have pain and says that he was not discharged from the UC with any pain medication. Pain feels like multiple previous episodes of renal colic. Patient notes urinary frequency and a sensation that he is unable to completely empty his bladder with urination.   No fever. He has been nauseated but denies vomiting. Pain was 10/10 at its worst. Currently 6/10. Aching in nature. Declines pain medication. Nothing makes pain worse or better.   Past Medical History  Diagnosis Date  . Arthritis   . Kidney stone    Past Surgical History  Procedure Laterality Date  . Hernia repair     No family history on file. History  Substance Use Topics  . Smoking status: Former Research scientist (life sciences)  . Smokeless tobacco: Current User  . Alcohol Use: 2.4 oz/week    4 Cans of beer per week    Review of Systems Ten point review of symptoms performed and is negative with the exception of symptoms noted above.     Allergies  Review of patient's allergies indicates no known allergies.  Home Medications   Prior to Admission medications   Medication Sig Start Date End Date Taking? Authorizing Provider  tamsulosin (FLOMAX) 0.4 MG CAPS capsule Take 1 capsule (0.4 mg total) by mouth daily. 09/27/13   Darlyne Russian, MD   BP 158/98  Pulse 61  Temp(Src) 97.7 F (36.5 C) (Oral)  Resp 20  Ht 5\' 9"  (1.753 m)  Wt 207 lb 2 oz (93.951 kg)   BMI 30.57 kg/m2  SpO2 100% Physical Exam Gen: well developed and well nourished appearing Head: NCAT Eyes: PERL, EOMI Nose: no epistaixis or rhinorrhea Mouth/throat: mucosa is moist and pink Neck: supple, no stridor Lungs: CTA B, no wheezing, rhonchi or rales CV: RRR, no murmur, extremities appear well perfused.  Abd: soft, notender, nondistended Back: no ttp, no cva ttp Skin: warm and dry Ext: normal to inspection, no dependent edema Neuro: CN ii-xii grossly intact, no focal deficits Psyche; normal affect,  calm and cooperative.  ED Course  Procedures (including critical care time) Labs Review  Results for orders placed during the hospital encounter of 09/28/13 (from the past 24 hour(s))  CBC WITH DIFFERENTIAL     Status: Abnormal   Collection Time    09/28/13 12:39 AM      Result Value Ref Range   WBC 5.4  4.0 - 10.5 K/uL   RBC 5.09  4.22 - 5.81 MIL/uL   Hemoglobin 13.5  13.0 - 17.0 g/dL   HCT 41.8  39.0 - 52.0 %   MCV 82.1  78.0 - 100.0 fL   MCH 26.5  26.0 - 34.0 pg   MCHC 32.3  30.0 - 36.0 g/dL   RDW 14.6  11.5 - 15.5 %   Platelets 234  150 - 400 K/uL   Neutrophils Relative % 42 (*) 43 -  77 %   Neutro Abs 2.2  1.7 - 7.7 K/uL   Lymphocytes Relative 43  12 - 46 %   Lymphs Abs 2.3  0.7 - 4.0 K/uL   Monocytes Relative 9  3 - 12 %   Monocytes Absolute 0.5  0.1 - 1.0 K/uL   Eosinophils Relative 6 (*) 0 - 5 %   Eosinophils Absolute 0.3  0.0 - 0.7 K/uL   Basophils Relative 0  0 - 1 %   Basophils Absolute 0.0  0.0 - 0.1 K/uL  COMPREHENSIVE METABOLIC PANEL     Status: Abnormal   Collection Time    09/28/13 12:39 AM      Result Value Ref Range   Sodium 138  137 - 147 mEq/L   Potassium 3.5 (*) 3.7 - 5.3 mEq/L   Chloride 103  96 - 112 mEq/L   CO2 23  19 - 32 mEq/L   Glucose, Bld 124 (*) 70 - 99 mg/dL   BUN 8  6 - 23 mg/dL   Creatinine, Ser 1.41 (*) 0.50 - 1.35 mg/dL   Calcium 9.1  8.4 - 10.5 mg/dL   Total Protein 7.2  6.0 - 8.3 g/dL   Albumin 3.9  3.5 - 5.2 g/dL    AST 31  0 - 37 U/L   ALT 21  0 - 53 U/L   Alkaline Phosphatase 76  39 - 117 U/L   Total Bilirubin <0.2 (*) 0.3 - 1.2 mg/dL   GFR calc non Af Amer 50 (*) >90 mL/min   GFR calc Af Amer 58 (*) >90 mL/min  LIPASE, BLOOD     Status: Abnormal   Collection Time    09/28/13 12:39 AM      Result Value Ref Range   Lipase 62 (*) 11 - 59 U/L  URINALYSIS, ROUTINE W REFLEX MICROSCOPIC     Status: Abnormal   Collection Time    09/28/13  5:00 AM      Result Value Ref Range   Color, Urine YELLOW  YELLOW   APPearance CLEAR  CLEAR   Specific Gravity, Urine 1.010  1.005 - 1.030   pH 5.5  5.0 - 8.0   Glucose, UA NEGATIVE  NEGATIVE mg/dL   Hgb urine dipstick TRACE (*) NEGATIVE   Bilirubin Urine NEGATIVE  NEGATIVE   Ketones, ur NEGATIVE  NEGATIVE mg/dL   Protein, ur NEGATIVE  NEGATIVE mg/dL   Urobilinogen, UA 0.2  0.0 - 1.0 mg/dL   Nitrite NEGATIVE  NEGATIVE   Leukocytes, UA NEGATIVE  NEGATIVE  URINE MICROSCOPIC-ADD ON     Status: None   Collection Time    09/28/13  5:00 AM      Result Value Ref Range   WBC, UA 0-2  <3 WBC/hpf   RBC / HPF 3-6  <3 RBC/hpf   Bacteria, UA RARE  RARE    MDM   DDX: renal colic, UTI, urinary retention.   Patient is feeling better. Pain free. No nausea or vomiting. Clinical picture fits with renal colic - supported by history of same and microscopic hematuria. The patient is stable for discharge with plan for symptomatic management, continuation of Flomax and follow up with Urology. Counseled re: return precautions.   Elyn Peers, MD 09/28/13 [phone]

## 2013-09-28 NOTE — ED Notes (Signed)
C/o generalized abd pain, L flank pain, and groin pain x 1 hour.  States he was see at [facility] Sunday afternoon and was told he had a kidney stone.  Denies nausea and vomiting.  Reports "weak stream" when urinating.

## 2013-10-29 ENCOUNTER — Ambulatory Visit (AMBULATORY_SURGERY_CENTER): Payer: Medicare Other | Admitting: *Deleted

## 2013-10-29 VITALS — Ht 70.0 in | Wt 207.0 lb

## 2013-10-29 DIAGNOSIS — Z860100 Personal history of colon polyps, unspecified: Secondary | ICD-10-CM

## 2013-10-29 MED ORDER — MOVIPREP 100 G PO SOLR: ORAL | Status: DC

## 2013-10-29 NOTE — Progress Notes (Signed)
No egg or soy allergy  Pt denies anesthesia problems or being told he is difficult to intubate

## 2013-11-04 ENCOUNTER — Encounter: Payer: Self-pay | Admitting: Gastroenterology

## 2013-11-12 ENCOUNTER — Encounter: Payer: Self-pay | Admitting: Gastroenterology

## 2013-11-12 ENCOUNTER — Ambulatory Visit (AMBULATORY_SURGERY_CENTER): Payer: Medicare Other | Admitting: Gastroenterology

## 2013-11-12 VITALS — BP 148/74 | HR 66 | Temp 97.0°F | Resp 16 | Ht 70.0 in | Wt 207.0 lb

## 2013-11-12 DIAGNOSIS — D126 Benign neoplasm of colon, unspecified: Secondary | ICD-10-CM

## 2013-11-12 DIAGNOSIS — Z86010 Personal history of colonic polyps: Secondary | ICD-10-CM

## 2013-11-12 MED ORDER — SODIUM CHLORIDE 0.9 % IV SOLN: 500.0000 mL | INTRAVENOUS | Status: DC

## 2013-11-12 NOTE — Progress Notes (Signed)
Called to room to assist during endoscopic procedure.  Patient ID and intended procedure confirmed with present staff. Received instructions for my participation in the procedure from the performing physician.  

## 2013-11-12 NOTE — Op Note (Signed)
[place]  [address]. [place], [address]   COLONOSCOPY PROCEDURE REPORT  PATIENT: Jeffrey Hull, Jeffrey Hull  MR#: [id number] BIRTHDATE: May 12, 1945 , 73  yrs. old GENDER: Male ENDOSCOPIST: Ladene Artist, MD, [facility] PROCEDURE DATE:  11/12/2013 PROCEDURE:   Colonoscopy with biopsy and snare polypectomy First Screening Colonoscopy - Avg.  risk and is 50 yrs.  old or older - No.  Prior Negative Screening - Now for repeat screening. N/A  History of Adenoma - Now for follow-up colonoscopy & has been > or = to 3 yrs.  Yes hx of adenoma.  Has been 3 or more years since last colonoscopy.  Polyps Removed Today? Yes. ASA CLASS:   Class II INDICATIONS:Patient's personal history of adenomatous colon polyps.  MEDICATIONS: MAC sedation, administered by CRNA and propofol (Diprivan) 200mg  IV DESCRIPTION OF PROCEDURE:   After the risks benefits and alternatives of the procedure were thoroughly explained, informed consent was obtained.  A digital rectal exam revealed no abnormalities of the rectum.   The [id number]  endoscope was introduced through the anus and advanced to the cecum, which was identified by both the appendix and ileocecal valve. No adverse events experienced.   The quality of the prep was good, using MoviPrep  The instrument was then slowly withdrawn as the colon was fully examined.  COLON FINDINGS: Two sessile polyps measuring 4 mm in size were found in the transverse colon and sigmoid colon.  A polypectomy was performed with cold forceps.  The resection was complete and the polyp tissue was completely retrieved.   Two sessile polyps measuring 5-7 mm in size were found in the sigmoid colon.  A polypectomy was performed with a cold snare.  The resection was complete and the polyp tissue was completely retrieved.   The colon was otherwise normal.  There was no diverticulosis, inflammation, polyps or cancers unless previously stated.  Retroflexed  views revealed moderate internal hemorrhoids. The time to cecum=1 minutes 10 seconds.  Withdrawal time=9 minutes 31 seconds.  The scope was withdrawn and the procedure completed.  COMPLICATIONS: There were no complications.  ENDOSCOPIC IMPRESSION: 1.   Two sessile polyps measuring 4 mm in the transverse and sigmoid colon; polypectomy performed with cold forceps 2.   Two sessile polyps measuring 5-7 mm in the sigmoid colon; polypectomy performed with a cold snare 3.   Moderate internal hemorrhoids  RECOMMENDATIONS: 1.  Await pathology results 2.  Repeat Colonoscopy in 5 years.  eSigned:  Ladene Artist, MD, [facility] 11/12/2013 8:51 AM

## 2013-11-12 NOTE — Patient Instructions (Signed)
YOU HAD AN ENDOSCOPIC PROCEDURE TODAY AT THE  ENDOSCOPY CENTER: Refer to the procedure report that was given to you for any specific questions about what was found during the examination.  If the procedure report does not answer your questions, please call your gastroenterologist to clarify.  If you requested that your care partner not be given the details of your procedure findings, then the procedure report has been included in a sealed envelope for you to review at your convenience later.  YOU SHOULD EXPECT: Some feelings of bloating in the abdomen. Passage of more gas than usual.  Walking can help get rid of the air that was put into your GI tract during the procedure and reduce the bloating. If you had a lower endoscopy (such as a colonoscopy or flexible sigmoidoscopy) you may notice spotting of blood in your stool or on the toilet paper. If you underwent a bowel prep for your procedure, then you may not have a normal bowel movement for a few days.  DIET: Your first meal following the procedure should be a light meal and then it is ok to progress to your normal diet.  A half-sandwich or bowl of soup is an example of a good first meal.  Heavy or fried foods are harder to digest and may make you feel nauseous or bloated.  Likewise meals heavy in dairy and vegetables can cause extra gas to form and this can also increase the bloating.  Drink plenty of fluids but you should avoid alcoholic beverages for 24 hours.  ACTIVITY: Your care partner should take you home directly after the procedure.  You should plan to take it easy, moving slowly for the rest of the day.  You can resume normal activity the day after the procedure however you should NOT DRIVE or use heavy machinery for 24 hours (because of the sedation medicines used during the test).    SYMPTOMS TO REPORT IMMEDIATELY: A gastroenterologist can be reached at any hour.  During normal business hours, 8:30 AM to 5:00 PM Monday through Friday,  call [phone].  After hours and on weekends, please call the GI answering service at [phone] who will take a message and have the physician on call contact you.   Following lower endoscopy (colonoscopy or flexible sigmoidoscopy):  Excessive amounts of blood in the stool  Significant tenderness or worsening of abdominal pains  Swelling of the abdomen that is new, acute  Fever of 100F or higher  FOLLOW UP: If any biopsies were taken you will be contacted by phone or by letter within the next 1-3 weeks.  Call your gastroenterologist if you have not heard about the biopsies in 3 weeks.  Our staff will call the home number listed on your records the next business day following your procedure to check on you and address any questions or concerns that you may have at that time regarding the information given to you following your procedure. This is a courtesy call and so if there is no answer at the home number and we have not heard from you through the emergency physician on call, we will assume that you have returned to your regular daily activities without incident.  SIGNATURES/CONFIDENTIALITY: You and/or your care partner have signed paperwork which will be entered into your electronic medical record.  These signatures attest to the fact that that the information above on your After Visit Summary has been reviewed and is understood.  Full responsibility of the confidentiality of this   discharge information lies with you and/or your care-partner.  Polyps, hemorrhoids-handouts given  Repeat colonoscopy 5 years-2020.

## 2013-11-12 NOTE — Progress Notes (Signed)
Report to PACU, RN, vss, BBS= Clear.  

## 2013-11-13 ENCOUNTER — Telehealth: Payer: Self-pay

## 2013-11-13 NOTE — Telephone Encounter (Signed)
  Follow up Call-  Call back number 11/12/2013  Post procedure Call Back phone  # [phone]  Permission to leave phone message Yes     Patient questions:  Do you have a fever, pain , or abdominal swelling? No. Pain Score  0 *  Have you tolerated food without any problems? Yes.    Have you been able to return to your normal activities? Yes.    Do you have any questions about your discharge instructions: Diet   No. Medications  No. Follow up visit  No.  Do you have questions or concerns about your Care? No.  Actions: * If pain score is 4 or above: No action needed, pain <4.  No problems per the pt. Maw

## 2013-11-17 ENCOUNTER — Encounter: Payer: Self-pay | Admitting: Gastroenterology

## 2013-12-03 ENCOUNTER — Encounter (HOSPITAL_COMMUNITY): Payer: Self-pay | Admitting: Emergency Medicine

## 2013-12-03 ENCOUNTER — Emergency Department (HOSPITAL_COMMUNITY)
Admission: EM | Admit: 2013-12-03 | Discharge: 2013-12-03 | Disposition: A | Discharge status: Home / Self Care | Payer: Medicare Other | Attending: Emergency Medicine | Admitting: Emergency Medicine

## 2013-12-03 ENCOUNTER — Emergency Department (HOSPITAL_COMMUNITY): Payer: Medicare Other

## 2013-12-03 DIAGNOSIS — Z79899 Other long term (current) drug therapy: Secondary | ICD-10-CM | POA: Insufficient documentation

## 2013-12-03 DIAGNOSIS — N200 Calculus of kidney: Secondary | ICD-10-CM | POA: Insufficient documentation

## 2013-12-03 DIAGNOSIS — R109 Unspecified abdominal pain: Secondary | ICD-10-CM | POA: Insufficient documentation

## 2013-12-03 DIAGNOSIS — Z9889 Other specified postprocedural states: Secondary | ICD-10-CM | POA: Diagnosis not present

## 2013-12-03 DIAGNOSIS — Z8669 Personal history of other diseases of the nervous system and sense organs: Secondary | ICD-10-CM | POA: Diagnosis not present

## 2013-12-03 DIAGNOSIS — Z87891 Personal history of nicotine dependence: Secondary | ICD-10-CM | POA: Diagnosis not present

## 2013-12-03 LAB — CBC WITH DIFFERENTIAL/PLATELET
BASOS ABS: 0 10*3/uL (ref 0.0–0.1)
BASOS PCT: 0 % (ref 0–1)
EOS ABS: 0.1 10*3/uL (ref 0.0–0.7)
EOS PCT: 2 % (ref 0–5)
HCT: 44 % (ref 39.0–52.0)
Hemoglobin: 14.4 g/dL (ref 13.0–17.0)
Lymphocytes Relative: 32 % (ref 12–46)
Lymphs Abs: 2 10*3/uL (ref 0.7–4.0)
MCH: 27 pg (ref 26.0–34.0)
MCHC: 32.7 g/dL (ref 30.0–36.0)
MCV: 82.4 fL (ref 78.0–100.0)
Monocytes Absolute: 0.5 10*3/uL (ref 0.1–1.0)
Monocytes Relative: 8 % (ref 3–12)
NEUTROS PCT: 58 % (ref 43–77)
Neutro Abs: 3.6 10*3/uL (ref 1.7–7.7)
PLATELETS: 211 10*3/uL (ref 150–400)
RBC: 5.34 MIL/uL (ref 4.22–5.81)
RDW: 14.9 % (ref 11.5–15.5)
WBC: 6.3 10*3/uL (ref 4.0–10.5)

## 2013-12-03 LAB — URINALYSIS, ROUTINE W REFLEX MICROSCOPIC
BILIRUBIN URINE: NEGATIVE
Glucose, UA: NEGATIVE mg/dL
Ketones, ur: NEGATIVE mg/dL
Leukocytes, UA: NEGATIVE
NITRITE: NEGATIVE
PROTEIN: NEGATIVE mg/dL
Specific Gravity, Urine: 1.028 (ref 1.005–1.030)
UROBILINOGEN UA: 0.2 mg/dL (ref 0.0–1.0)
pH: 5 (ref 5.0–8.0)

## 2013-12-03 LAB — I-STAT CHEM 8, ED
BUN: 14 mg/dL (ref 6–23)
Calcium, Ion: 1.2 mmol/L (ref 1.13–1.30)
Chloride: 103 mEq/L (ref 96–112)
Creatinine, Ser: 1.8 mg/dL — ABNORMAL HIGH (ref 0.50–1.35)
Glucose, Bld: 162 mg/dL — ABNORMAL HIGH (ref 70–99)
HEMATOCRIT: 48 % (ref 39.0–52.0)
Hemoglobin: 16.3 g/dL (ref 13.0–17.0)
POTASSIUM: 3.9 meq/L (ref 3.7–5.3)
SODIUM: 142 meq/L (ref 137–147)
TCO2: 25 mmol/L (ref 0–100)

## 2013-12-03 LAB — URINE MICROSCOPIC-ADD ON

## 2013-12-03 MED ORDER — TAMSULOSIN HCL 0.4 MG PO CAPS
0.4000 mg | ORAL_CAPSULE | Freq: Once | ORAL | Status: AC
  Administered 2013-12-03: 0.4 mg via ORAL
  Filled 2013-12-03: qty 1

## 2013-12-03 MED ORDER — HYDROCODONE-ACETAMINOPHEN 5-325 MG PO TABS: 1.0000 | ORAL_TABLET | ORAL | Status: DC | PRN

## 2013-12-03 MED ORDER — HYDROMORPHONE HCL PF 1 MG/ML IJ SOLN
1.0000 mg | Freq: Once | INTRAMUSCULAR | Status: AC
  Administered 2013-12-03: 1 mg via INTRAVENOUS
  Filled 2013-12-03: qty 1

## 2013-12-03 MED ORDER — ONDANSETRON 4 MG PO TBDP: 4.0000 mg | ORAL_TABLET | Freq: Three times a day (TID) | ORAL | Status: DC | PRN

## 2013-12-03 MED ORDER — SODIUM CHLORIDE 0.9 % IV SOLN
Freq: Once | INTRAVENOUS | Status: AC
  Administered 2013-12-03: 05:00:00 via INTRAVENOUS

## 2013-12-03 MED ORDER — TAMSULOSIN HCL 0.4 MG PO CAPS: 0.4000 mg | ORAL_CAPSULE | Freq: Every day | ORAL | Status: DC

## 2013-12-03 MED ORDER — ONDANSETRON HCL 4 MG/2ML IJ SOLN
4.0000 mg | Freq: Once | INTRAMUSCULAR | Status: AC
  Administered 2013-12-03: 4 mg via INTRAVENOUS
  Filled 2013-12-03: qty 2

## 2013-12-03 MED ORDER — KETOROLAC TROMETHAMINE 30 MG/ML IJ SOLN
30.0000 mg | Freq: Once | INTRAMUSCULAR | Status: AC
  Administered 2013-12-03: 30 mg via INTRAVENOUS
  Filled 2013-12-03: qty 1

## 2013-12-03 NOTE — ED Notes (Signed)
Pt. woke up this morning with left flank pain / emesis x 2 , denies hematuria or dysuria . No fever or chills. Pt. stated history of kidney stone.

## 2013-12-03 NOTE — ED Provider Notes (Signed)
CSN: [id number]     Arrival date & time 12/03/13  6144 History   First MD Initiated Contact with Patient 12/03/13 0425     Chief Complaint  Patient presents with  . Flank Pain     (Consider location/radiation/quality/duration/timing/severity/associated sxs/prior Treatment) HPI Comments: Woke with L flank pain nausea and vomiting  That is consistent with previous kidney stones   Patient is a 68 y.o. male presenting with flank pain. The history is provided by the patient.  Flank Pain This is a recurrent problem. The current episode started today. The problem occurs intermittently. The problem has been waxing and waning. Associated symptoms include abdominal pain, nausea and vomiting. Pertinent negatives include no fever, urinary symptoms or weakness. Nothing aggravates the symptoms. He has tried nothing for the symptoms. The treatment provided no relief.    Past Medical History  Diagnosis Date  . Kidney stone   . Cataract    Past Surgical History  Procedure Laterality Date  . Hernia repair    . Colonoscopy    . Shoulder open rotator cuff repair      both shoulders  . Knee surgery      both knees   Family History  Problem Relation Age of Onset  . Colon cancer Neg Hx   . Esophageal cancer Neg Hx   . Stomach cancer Neg Hx   . Rectal cancer Neg Hx    History  Substance Use Topics  . Smoking status: Former Research scientist (life sciences)  . Smokeless tobacco: Current User    Types: Chew  . Alcohol Use: 2.4 oz/week    4 Cans of beer per week    Review of Systems  Constitutional: Negative for fever.  Gastrointestinal: Positive for nausea, vomiting and abdominal pain. Negative for diarrhea and constipation.  Genitourinary: Positive for flank pain. Negative for dysuria and hematuria.  Neurological: Negative for weakness.  All other systems reviewed and are negative.     Allergies  Review of patient's allergies indicates no known allergies.  Home Medications   Prior to Admission medications    Medication Sig Start Date End Date Taking? Authorizing Provider  HYDROcodone-acetaminophen (NORCO/VICODIN) 5-325 MG per tablet Take 1-2 tablets by mouth every 4 (four) hours as needed. 12/03/13   Garald Balding, NP  ondansetron (ZOFRAN ODT) 4 MG disintegrating tablet Take 1 tablet (4 mg total) by mouth every 8 (eight) hours as needed for nausea or vomiting. 12/03/13   Garald Balding, NP  tamsulosin (FLOMAX) 0.4 MG CAPS capsule Take 1 capsule (0.4 mg total) by mouth daily after breakfast. 12/03/13   Garald Balding, NP   BP 144/94  Pulse 71  Temp(Src) 98.2 F (36.8 C) (Oral)  Resp 24  Ht 5\' 9"  (1.753 m)  Wt 199 lb (90.266 kg)  BMI 29.37 kg/m2  SpO2 95% Physical Exam  Nursing note and vitals reviewed. Constitutional: He appears well-developed and well-nourished.  HENT:  Head: Normocephalic.  Eyes: Pupils are equal, round, and reactive to light.  Neck: Normal range of motion.  Cardiovascular: Normal rate and regular rhythm.   Pulmonary/Chest: Effort normal and breath sounds normal.  Abdominal: Soft. He exhibits no distension. There is no tenderness.  Musculoskeletal: Normal range of motion.  Neurological: He is alert.  Skin: Skin is warm and dry. No rash noted.  Psychiatric: He has a normal mood and affect.    ED Course  Procedures (including critical care time) Labs Review Labs Reviewed  URINALYSIS, ROUTINE W REFLEX MICROSCOPIC - Abnormal; Notable for  the following:    Hgb urine dipstick TRACE (*)    All other components within normal limits  I-STAT CHEM 8, ED - Abnormal; Notable for the following:    Creatinine, Ser 1.80 (*)    Glucose, Bld 162 (*)    All other components within normal limits  CBC WITH DIFFERENTIAL  URINE MICROSCOPIC-ADD ON    Imaging Review Ct Abdomen Pelvis Wo Contrast  12/03/2013   CLINICAL DATA:  Left flank pain and emesis. No hematuria. Previous history of kidney stone.  EXAM: CT ABDOMEN AND PELVIS WITHOUT CONTRAST  TECHNIQUE: Multidetector CT imaging of  the abdomen and pelvis was performed following the standard protocol without IV contrast.  COMPARISON:  03/08/2008  FINDINGS: Lung bases are clear.  Small esophageal hiatal hernia.  The left kidney is enlarged with respect to the right. There is left hydronephrosis and hydroureter. Stranding around the ureter and kidney. There is a tiny punctate stone in the distal left ureter at the ureterovesical junction, measuring less than 2 mm in size. There is another larger stone in the bladder measuring 6 mm diameter which may be a recently passed stone. Multiple intrarenal stones are demonstrated in the right kidney, largest in the upper pole measuring 4 mm diameter. No evidence of obstruction or ureteral stone on the right.  Unenhanced appearance of the liver, spleen, gallbladder, pancreas, adrenal glands, abdominal aorta, inferior vena cava, and retroperitoneal lymph nodes is unremarkable. Nonspecific hazy infiltration in the mesentery may represent edema or inflammatory change. Stomach and small bowel are decompressed. Stool fills the colon without distention. No free air or free fluid in the abdomen. Moderate-sized umbilical hernia containing fat.  Pelvis: Prostate gland is enlarged, measuring 4.4 x 5 cm diameter. No free or loculated pelvic fluid collections. No pelvic mass or lymphadenopathy. Appendix is normal. No evidence of diverticulitis. Degenerative changes in the spine and hips. No destructive bone lesions appreciated.  IMPRESSION: There is evidence of moderate left ureteral obstruction with a punctate stone in the distal left ureter and a 6 mm stone of in the bladder, likely representing a recently passed stone. Additional nonobstructing intrarenal stones on the right kidney. Umbilical hernia containing fat. Small esophageal hiatal hernia.   Electronically Signed   By: Lucienne Capers M.D.   On: 12/03/2013 05:19     EKG Interpretation None      MDM  Stones seen discussed with patient for follow up   Final diagnoses:  Kidney stone  Renal stones         Garald Balding, NP 12/03/13 0602

## 2013-12-03 NOTE — Discharge Instructions (Signed)
Kidney Stones °Kidney stones (urolithiasis) are solid masses that form inside your kidneys. The intense pain is caused by the stone moving through the kidney, ureter, bladder, and urethra (urinary tract). When the stone moves, the ureter starts to spasm around the stone. The stone is usually passed in your pee (urine).  °HOME CARE °· Drink enough fluids to keep your pee clear or pale yellow. This helps to get the stone out. °· Strain all pee through the provided strainer. Do not pee without peeing through the strainer, not even once. If you pee the stone out, catch it in the strainer. The stone may be as small as a grain of salt. Take this to your doctor. This will help your doctor figure out what you can do to try to prevent more kidney stones. °· Only take medicine as told by your doctor. °· Follow up with your doctor as told. °· Get follow-up X-rays as told by your doctor. °GET HELP IF: °You have pain that gets worse even if you have been taking pain medicine. °GET HELP RIGHT AWAY IF:  °· Your pain does not get better with medicine. °· You have a fever or shaking chills. °· Your pain increases and gets worse over 18 hours. °· You have new belly (abdominal) pain. °· You feel faint or pass out. °· You are unable to pee. °MAKE SURE YOU:  °· Understand these instructions. °· Will watch your condition. °· Will get help right away if you are not doing well or get worse. °Document Released: 10/10/2007 Document Revised: 12/24/2012 Document Reviewed: 09/24/2012 °Exit[employer]® Patient Information ©2015 [employer]. This information is not intended to replace advice given to you by your health care provider. Make sure you discuss any questions you have with your health care provider. ° °Dietary Guidelines to Help Prevent Kidney Stones °Your risk of kidney stones can be decreased by adjusting the foods you eat. The most important thing you can do is drink enough fluid. You should drink enough fluid to keep your urine clear or  pale yellow. The following guidelines provide specific information for the type of kidney stone you have had. °GUIDELINES ACCORDING TO TYPE OF KIDNEY STONE °Calcium Oxalate Kidney Stones °· Reduce the amount of salt you eat. Foods that have a lot of salt cause your body to release excess calcium into your urine. The excess calcium can combine with a substance called oxalate to form kidney stones. °· Reduce the amount of animal protein you eat if the amount you eat is excessive. Animal protein causes your body to release excess calcium into your urine. Ask your dietitian how much protein from animal sources you should be eating. °· Avoid foods that are high in oxalates. If you take vitamins, they should have less than 500 mg of vitamin C. Your body turns vitamin C into oxalates. You do not need to avoid fruits and vegetables high in vitamin C. °Calcium Phosphate Kidney Stones °· Reduce the amount of salt you eat to help prevent the release of excess calcium into your urine. °· Reduce the amount of animal protein you eat if the amount you eat is excessive. Animal protein causes your body to release excess calcium into your urine. Ask your dietitian how much protein from animal sources you should be eating. °· Get enough calcium from food or take a calcium supplement (ask your dietitian for recommendations). Food sources of calcium that do not increase your risk of kidney stones include: °¨ Broccoli. °¨ Dairy products,   such as cheese and yogurt.  Pudding. Uric Acid Kidney Stones  Do not have more than 6 oz of animal protein per day. FOOD SOURCES Animal Protein Sources  Meat (all types).  Poultry.  Eggs.  Fish, seafood. Foods High in [employer] seasonings.  Soy sauce.  Teriyaki sauce.  Cured and processed meats.  Salted crackers and snack foods.  Fast food.  Canned soups and most canned foods. Foods High in Oxalates  Grains:  Amaranth.  Barley.  Grits.  Wheat  germ.  Bran.  Buckwheat flour.  All bran cereals.  Pretzels.  Whole wheat bread.  Vegetables:  Beans (wax).  Beets and beet greens.  Collard greens.  Eggplant.  [place].  Leeks.  Okra.  Parsley.  [place]abagas.  Spinach.  Swiss chard.  Tomato paste.  Fried potatoes.  Sweet potatoes.  Fruits:  Red currants.  Figs.  Kiwi.  Rhubarb.  Meat and Other Protein Sources:  Beans (dried).  Soy burgers and other soybean products.  Miso.  Nuts (peanuts, almonds, pecans, cashews, hazelnuts).  Nut butters.  Sesame seeds and tahini (paste made of sesame seeds).  Poppy seeds.  Beverages:  Chocolate drink mixes.  Soy milk.  Instant iced tea.  Juices made from high-oxalate fruits or vegetables.  Other:  Carob.  Chocolate.  Fruitcake.  Marmalades. Document Released: 08/18/2010 Document Revised: 04/28/2013 Document Reviewed: 03/20/2013 [facility] Patient Information 2015 [place], [place]. This information is not intended to replace advice given to you by your health care provider. Make sure you discuss any questions you have with your health care provider. As discussed you passed one stone into your bladder and another is on the move on the left  You have multiple stones in the right kidney as well Strain all urine You have been given prescriptions for nausea, pain and one to help dilate the ureter to help pass the stone

## 2013-12-03 NOTE — ED Provider Notes (Signed)
Medical screening examination/treatment/procedure(s) were performed by non-physician practitioner and as supervising physician I was immediately available for consultation/collaboration.   EKG Interpretation None       Eldana Isip K Kassaundra Hair-Rasch, MD 12/03/13 [phone]

## 2014-07-05 ENCOUNTER — Encounter: Payer: Self-pay | Admitting: *Deleted

## 2014-08-09 ENCOUNTER — Encounter: Payer: Self-pay | Admitting: Family Medicine

## 2014-08-09 DIAGNOSIS — E669 Obesity, unspecified: Secondary | ICD-10-CM | POA: Insufficient documentation

## 2014-08-09 DIAGNOSIS — F17200 Nicotine dependence, unspecified, uncomplicated: Secondary | ICD-10-CM | POA: Insufficient documentation

## 2014-09-15 ENCOUNTER — Encounter: Payer: Self-pay | Admitting: *Deleted

## 2014-11-04 ENCOUNTER — Ambulatory Visit (INDEPENDENT_AMBULATORY_CARE_PROVIDER_SITE_OTHER): Payer: Medicare Other

## 2014-11-04 ENCOUNTER — Ambulatory Visit (INDEPENDENT_AMBULATORY_CARE_PROVIDER_SITE_OTHER): Payer: Medicare Other | Admitting: Family Medicine

## 2014-11-04 VITALS — BP 122/88 | HR 77 | Temp 98.5°F | Resp 18 | Ht 70.0 in | Wt 207.0 lb

## 2014-11-04 DIAGNOSIS — M25571 Pain in right ankle and joints of right foot: Secondary | ICD-10-CM | POA: Diagnosis not present

## 2014-11-04 DIAGNOSIS — M795 Residual foreign body in soft tissue: Secondary | ICD-10-CM

## 2014-11-04 DIAGNOSIS — Z23 Encounter for immunization: Secondary | ICD-10-CM | POA: Diagnosis not present

## 2014-11-04 DIAGNOSIS — S91331A Puncture wound without foreign body, right foot, initial encounter: Secondary | ICD-10-CM

## 2014-11-04 DIAGNOSIS — I781 Nevus, non-neoplastic: Secondary | ICD-10-CM | POA: Diagnosis not present

## 2014-11-04 MED ORDER — LEVOFLOXACIN 500 MG PO TABS: 500.0000 mg | ORAL_TABLET | Freq: Every day | ORAL | Status: DC

## 2014-11-04 NOTE — Patient Instructions (Signed)
Levofloxacin tablets What is this medicine? LEVOFLOXACIN (lee voe FLOX a sin) is a quinolone antibiotic. It is used to treat certain kinds of bacterial infections. It will not work for colds, flu, or other viral infections. This medicine may be used for other purposes; ask your health care provider or pharmacist if you have questions. COMMON BRAND NAME(S): Levaquin, Levaquin Leva-Pak What should I tell my health care provider before I take this medicine? They need to know if you have any of these conditions: -cerebral disease -irregular heartbeat -kidney disease -seizure disorder -an unusual or allergic reaction to levofloxacin, other antibiotics or medicines, foods, dyes, or preservatives -pregnant or trying to get pregnant -breast-feeding How should I use this medicine? Take this medicine by mouth with a full glass of water. Follow the directions on the prescription label. This medicine can be taken with or without food. Take your medicine at regular intervals. Do not take your medicine more often than directed. Do not skip doses or stop your medicine early even if you feel better. Do not stop taking except on your doctor's advice. A special MedGuide will be given to you by the pharmacist with each prescription and refill. Be sure to read this information carefully each time. Talk to your pediatrician regarding the use of this medicine in children. While this drug may be prescribed for children as young as 6 months for selected conditions, precautions do apply. Overdosage: If you think you have taken too much of this medicine contact a poison control center or emergency room at once. NOTE: This medicine is only for you. Do not share this medicine with others. What if I miss a dose? If you miss a dose, take it as soon as you remember. If it is almost time for your next dose, take only that dose. Do not take double or extra doses. What may interact with this medicine? Do not take this medicine  with any of the following medications: -arsenic trioxide -chloroquine -droperidol -medicines for irregular heart rhythm like amiodarone, disopyramide, dofetilide, flecainide, quinidine, procainamide, sotalol -some medicines for depression or mental problems like phenothiazines, pimozide, and ziprasidone This medicine may also interact with the following medications: -amoxapine -antacids -birth control pills -cisapride -dairy products -didanosine (ddI) buffered tablets or powder -haloperidol -multivitamins -NSAIDS, medicines for pain and inflammation, like ibuprofen or naproxen -retinoid products like tretinoin or isotretinoin -risperidone -some other antibiotics like clarithromycin or erythromycin -sucralfate -theophylline -warfarin This list may not describe all possible interactions. Give your health care provider a list of all the medicines, herbs, non-prescription drugs, or dietary supplements you use. Also tell them if you smoke, drink alcohol, or use illegal drugs. Some items may interact with your medicine. What should I watch for while using this medicine? Tell your doctor or health care professional if your symptoms do not improve or if they get worse. Drink several glasses of water a day and cut down on drinks that contain caffeine. You must not get dehydrated while taking this medicine. You may get drowsy or dizzy. Do not drive, use machinery, or do anything that needs mental alertness until you know how this medicine affects you. Do not sit or stand up quickly, especially if you are an older patient. This reduces the risk of dizzy or fainting spells. This medicine can make you more sensitive to the sun. Keep out of the sun. If you cannot avoid being in the sun, wear protective clothing and use a sunscreen. Do not use sun lamps or tanning beds/booths.   Contact your doctor if you get a sunburn. If you are a diabetic monitor your blood glucose carefully. If you get an unusual  reading stop taking this medicine and call your doctor right away. Do not treat diarrhea with over-the-counter products. Contact your doctor if you have diarrhea that lasts more than 2 days or if the diarrhea is severe and watery. Avoid antacids, calcium, iron, and zinc products for 2 hours before and 2 hours after taking a dose of this medicine. What side effects may I notice from receiving this medicine? Side effects that you should report to your doctor or health care professional as soon as possible: -allergic reactions like skin rash or hives, swelling of the face, lips, or tongue -changes in vision -confusion, nightmares or hallucinations -difficulty breathing -irregular heartbeat, chest pain -joint, muscle or tendon pain -pain or difficulty passing urine -persistent headache with or without blurred vision -redness, blistering, peeling or loosening of the skin, including inside the mouth -seizures -unusual pain, numbness, tingling, or weakness -vaginal irritation, discharge Side effects that usually do not require medical attention (report to your doctor or health care professional if they continue or are bothersome): -diarrhea -dry mouth -headache -stomach upset, nausea -trouble sleeping This list may not describe all possible side effects. Call your doctor for medical advice about side effects. You may report side effects to FDA at 1-800-FDA-1088. Where should I keep my medicine? Keep out of the reach of children. Store at room temperature between 15 and 30 degrees C (59 and 86 degrees F). Keep in a tightly closed container. Throw away any unused medicine after the expiration date. NOTE: This sheet is a summary. It may not cover all possible information. If you have questions about this medicine, talk to your doctor, pharmacist, or health care provider.  2015, Elsevier/Gold Standard. (2012-11-28 07:45:07)  

## 2014-11-04 NOTE — Progress Notes (Signed)
Chief Complaint:  Chief Complaint  Patient presents with  . Foreign Body    rt foot x1 week   . Nevus    on back would like to have looked at     HPI: Jeffrey Hull is a 69 y.o. Jeffrey who is here for   1.  1 week history of right foot pain, as he was wearing foot flops and was having pain in his right heel. He noticed that there is a tack on the floor but it did not go through the sole of the shoe. He also does not have diabetes, he does not know if he was walking on cement and there was glass in the area.He also was walking bare footed  It does not hurt him terrible when he he is wearing shoes but when he presses on it or walk on it barefooted and he can feel the sharp pain. He does have calluses. No weakness numbness or tingling. No infection. He's had this for a week, he does not have any fevers. He is eight years out from his tetanus injection. We will go ahead and just give him another injection at this time.  2.  Right flank mole that is hyperpigmented and dark and does not have pain or itching associated with this. He would like to see if there is any malignancy as an issue with it. He does not have a family history of skin cancer.  Past Medical History  Diagnosis Date  . Kidney stone   . Cataract    Past Surgical History  Procedure Laterality Date  . Hernia repair    . Colonoscopy    . Shoulder open rotator cuff repair      both shoulders  . Knee surgery      both knees   History   Social History  . Marital Status: Married    Spouse Name: N/A  . Number of Children: N/A  . Years of Education: N/A   Social History Main Topics  . Smoking status: Former Research scientist (life sciences)  . Smokeless tobacco: Current User    Types: Chew  . Alcohol Use: 2.4 oz/week    4 Cans of beer per week  . Drug Use: No  . Sexual Activity: Yes    Birth Control/ Protection: None   Other Topics Concern  . None   Social History Narrative   Family History  Problem Relation Age of Onset  .  Colon cancer Neg Hx   . Esophageal cancer Neg Hx   . Stomach cancer Neg Hx   . Rectal cancer Neg Hx    No Known Allergies Prior to Admission medications   Medication Sig Start Date End Date Taking? Authorizing Provider  HYDROcodone-acetaminophen (NORCO/VICODIN) 5-325 MG per tablet Take 1-2 tablets by mouth every 4 (four) hours as needed. Patient not taking: Reported on 11/04/2014 12/03/13   Junius Creamer, NP  ondansetron (ZOFRAN ODT) 4 MG disintegrating tablet Take 1 tablet (4 mg total) by mouth every 8 (eight) hours as needed for nausea or vomiting. Patient not taking: Reported on 11/04/2014 12/03/13   Junius Creamer, NP  tamsulosin [facility]) 0.4 MG CAPS capsule Take 1 capsule (0.4 mg total) by mouth daily after breakfast. Patient not taking: Reported on 11/04/2014 12/03/13   Junius Creamer, NP     ROS: The patient denies fevers, chills, night sweats, unintentional weight loss, chest pain, palpitations, wheezing, dyspnea on exertion, nausea, vomiting, abdominal pain, dysuria, hematuria, melena, numbness, weakness, or tingling.  All other systems have been reviewed and were otherwise negative with the exception of those mentioned in the HPI and as above.    PHYSICAL EXAM: Filed Vitals:   11/04/14 0817  BP: 122/88  Pulse: 77  Temp: 98.5 F (36.9 C)  Resp: 18   Filed Vitals:   11/04/14 0817  Height: 5\' 10"  (1.778 m)  Weight: 207 lb (93.895 kg)   Body mass index is 29.7 kg/(m^2).   General: Alert, no acute distress HEENT:  Normocephalic, atraumatic, oropharynx patent. EOMI, PERRLA Cardiovascular:  Regular rate and rhythm, no rubs murmurs or gallops.  No Carotid bruits, radial pulse intact. No pedal edema.  Respiratory: Clear to auscultation bilaterally.  No wheezes, rales, or rhonchi.  No cyanosis, no use of accessory musculature GI: No organomegaly, abdomen is soft and non-tender, positive bowel sounds.  No masses. Skin: + skin  hyperpiigmented mole  Flank about 5-10 cm in  diameter Neurologic: Facial musculature symmetric. Psychiatric: Patient is appropriate throughout our interaction. Lymphatic: No cervical lymphadenopathy Musculoskeletal: Gait antalgic due to pain in foot   LABS: Results for orders placed or performed during the hospital encounter of 12/03/13  Urinalysis, Routine w reflex microscopic  Result Value Ref Range   Color, Urine YELLOW YELLOW   APPearance CLEAR CLEAR   Specific Gravity, Urine 1.028 1.005 - 1.030   pH 5.0 5.0 - 8.0   Glucose, UA NEGATIVE NEGATIVE mg/dL   Hgb urine dipstick TRACE (A) NEGATIVE   Bilirubin Urine NEGATIVE NEGATIVE   Ketones, ur NEGATIVE NEGATIVE mg/dL   Protein, ur NEGATIVE NEGATIVE mg/dL   Urobilinogen, UA 0.2 0.0 - 1.0 mg/dL   Nitrite NEGATIVE NEGATIVE   Leukocytes, UA NEGATIVE NEGATIVE  CBC with Differential  Result Value Ref Range   WBC 6.3 4.0 - 10.5 K/uL   RBC 5.34 4.22 - 5.81 MIL/uL   Hemoglobin 14.4 13.0 - 17.0 g/dL   HCT 44.0 39.0 - 52.0 %   MCV 82.4 78.0 - 100.0 fL   MCH 27.0 26.0 - 34.0 pg   MCHC 32.7 30.0 - 36.0 g/dL   RDW 14.9 11.5 - 15.5 %   Platelets 211 150 - 400 K/uL   Neutrophils Relative % 58 43 - 77 %   Neutro Abs 3.6 1.7 - 7.7 K/uL   Lymphocytes Relative 32 12 - 46 %   Lymphs Abs 2.0 0.7 - 4.0 K/uL   Monocytes Relative 8 3 - 12 %   Monocytes Absolute 0.5 0.1 - 1.0 K/uL   Eosinophils Relative 2 0 - 5 %   Eosinophils Absolute 0.1 0.0 - 0.7 K/uL   Basophils Relative 0 0 - 1 %   Basophils Absolute 0.0 0.0 - 0.1 K/uL  Urine microscopic-add on  Result Value Ref Range   Squamous Epithelial / LPF RARE RARE   WBC, UA 0-2 <3 WBC/hpf   RBC / HPF 3-6 <3 RBC/hpf   Bacteria, UA RARE RARE   Urine-Other MUCOUS PRESENT   I-stat chem 8, ed  Result Value Ref Range   Sodium 142 137 - 147 mEq/L   Potassium 3.9 3.7 - 5.3 mEq/L   Chloride 103 96 - 112 mEq/L   BUN 14 6 - 23 mg/dL   Creatinine, Ser 1.80 (H) 0.50 - 1.35 mg/dL   Glucose, Bld 162 (H) 70 - 99 mg/dL   Calcium, Ion 1.20 1.13 -  1.30 mmol/L   TCO2 25 0 - 100 mmol/L   Hemoglobin 16.3 13.0 - 17.0 g/dL   HCT 48.0 39.0 -  52.0 %     EKG/XRAY:   Primary read interpreted by Dr. Marin Comment at [facility]. ? foreign body vs  Normal soft tissue at site of marker   ASSESSMENT/PLAN: Encounter Diagnoses  Name Primary?  . Foreign body (FB) in soft tissue Yes  . Puncture wound of heel, right, initial encounter   . Pain in joint, ankle and foot, right   . Need for prophylactic vaccination with tetanus-diphtheria (TD)   . Nevus, non-neoplastic    Advise to monitor for infection Wound care as directed He was given Levaquin to take as needed for any signs of infection since long weekend since was wearing flipflops , covers for pseundomonas Tetanus vaccine given  Fu prn   Gross sideeffects, risk and benefits, and alternatives of medications d/w patient. Patient is aware that all medications have potential sideeffects and we are unable to predict every sideeffect or drug-drug interaction that may occur.  Alton Bouknight, [place], DO 11/15/2014 7:06 AM

## 2014-11-04 NOTE — Progress Notes (Signed)
Procedure: Consent obtained.  Local anesthesia obtained with 1% lido with epi.  Area cleaned with betadine and prepped with sterile drape.  1 cm X-shaped incision made over the area where the patient feels FB.  After exploration a shard of glass was removed - no additional FB was felt.  Drsg placed and wound care was d/w pt.

## 2014-11-16 ENCOUNTER — Telehealth: Payer: Self-pay | Admitting: *Deleted

## 2014-11-16 NOTE — Telephone Encounter (Signed)
Patient phoned in response to my letters, requesting annual exam appt, preferably with a male.  The earliest available was with Philis Fendt, PA-C, 12/20/14.  Patient confirmed his insurance is with [employer] (I'm working a HQPAF on him) his address, home number, and added his mobile number.  Prior to scheduling, PCP clarification was requested and he said he usually just came to the walk in and saw whoever was available.  Patient was instructed on NPO status for labs, coming that morning so he wouldn't have to wait, etc...  Patient verbalized understanding and appreciation.

## 2014-12-15 ENCOUNTER — Encounter: Payer: Self-pay | Admitting: Gastroenterology

## 2014-12-20 ENCOUNTER — Encounter: Payer: Self-pay | Admitting: Physician Assistant

## 2014-12-20 ENCOUNTER — Ambulatory Visit (INDEPENDENT_AMBULATORY_CARE_PROVIDER_SITE_OTHER): Payer: Medicare Other | Admitting: Physician Assistant

## 2014-12-20 VITALS — BP 144/88 | HR 79 | Temp 98.1°F | Resp 16 | Ht 69.5 in | Wt 205.8 lb

## 2014-12-20 DIAGNOSIS — Z139 Encounter for screening, unspecified: Secondary | ICD-10-CM | POA: Diagnosis not present

## 2014-12-20 DIAGNOSIS — Z0000 Encounter for general adult medical examination without abnormal findings: Secondary | ICD-10-CM | POA: Diagnosis not present

## 2014-12-20 DIAGNOSIS — R21 Rash and other nonspecific skin eruption: Secondary | ICD-10-CM | POA: Diagnosis not present

## 2014-12-20 LAB — LIPID PANEL
Cholesterol: 149 mg/dL (ref 125–200)
HDL: 42 mg/dL (ref >=40)
LDL Cholesterol: 94 mg/dL (ref <130)
Total CHOL/HDL Ratio: 3.5 Ratio (ref <=5.0)
Triglycerides: 67 mg/dL (ref <150)
VLDL: 13 mg/dL (ref <30)

## 2014-12-20 LAB — COMPREHENSIVE METABOLIC PANEL
ALBUMIN: 4.1 g/dL (ref 3.6–5.1)
ALT: 19 U/L (ref 9–46)
AST: 29 U/L (ref 10–35)
Alkaline Phosphatase: 56 U/L (ref 40–115)
BILIRUBIN TOTAL: 0.5 mg/dL (ref 0.2–1.2)
BUN: 8 mg/dL (ref 7–25)
CO2: 23 mmol/L (ref 20–31)
CREATININE: 1.4 mg/dL — AB (ref 0.70–1.25)
Calcium: 9.3 mg/dL (ref 8.6–10.3)
Chloride: 107 mmol/L (ref 98–110)
GLUCOSE: 99 mg/dL (ref 65–99)
Potassium: 4.1 mmol/L (ref 3.5–5.3)
SODIUM: 142 mmol/L (ref 135–146)
Total Protein: 7 g/dL (ref 6.1–8.1)

## 2014-12-20 LAB — CBC
HCT: 44.2 % (ref 39.0–52.0)
HEMOGLOBIN: 14.3 g/dL (ref 13.0–17.0)
MCH: 26.5 pg (ref 26.0–34.0)
MCHC: 32.4 g/dL (ref 30.0–36.0)
MCV: 82 fL (ref 78.0–100.0)
MPV: 9.8 fL (ref 8.6–12.4)
Platelets: 244 10*3/uL (ref 150–400)
RBC: 5.39 MIL/uL (ref 4.22–5.81)
RDW: 15.4 % (ref 11.5–15.5)
WBC: 3.9 10*3/uL — AB (ref 4.0–10.5)

## 2014-12-20 LAB — POCT URINALYSIS DIPSTICK
BILIRUBIN UA: NEGATIVE
Glucose, UA: NEGATIVE
Ketones, UA: NEGATIVE
Leukocytes, UA: NEGATIVE
Nitrite, UA: NEGATIVE
Protein, UA: NEGATIVE
SPEC GRAV UA: 1.025
UROBILINOGEN UA: 0.2
pH, UA: 5

## 2014-12-20 NOTE — Progress Notes (Signed)
   Subjective:    Patient ID: Jeffrey Hull, male    DOB: [birth year], 69 y.o.   MRN: [id number]  HPI    Review of Systems  Constitutional: Negative.   HENT: Positive for sore throat.   Eyes: Negative.   Respiratory: Negative.   Cardiovascular: Negative.   Gastrointestinal: Negative.   Endocrine: Negative.   Genitourinary: Negative.   Musculoskeletal: Positive for arthralgias.  Skin: Negative.   Allergic/Immunologic: Negative.   Neurological: Negative.   Hematological: Negative.   Psychiatric/Behavioral: Negative.        Objective:   Physical Exam        Assessment & Plan:

## 2014-12-20 NOTE — Addendum Note (Signed)
Addended by: Tereasa Coop on: 12/20/2014 02:47 PM   Modules accepted: Level of Service, SmartSet

## 2014-12-20 NOTE — Progress Notes (Addendum)
12/20/2014 at 2:41 PM  Jeffrey Hull / DOB: 16-Jul-1945 / MRN: [id number]  The patient has Obesity and Tobacco dependency on his problem list.  SUBJECTIVE  Jeffrey Hull is a 69 y.o. well appearing male presenting for the chief complaint of an annual physical.  He has no complaints today.  He would like his PSA screened as his has had two cousins pass for prostate cancer.  He has quit smoking and reports drinking minimal alcohol weekly.  He walks forty minutes every day.  He takes no medications.  His last colonoscopy was negative in 2015 and he was advised to return in 2020.  He does not complain of dysthymia and anhedonia. He has never been to dermatology for a screening.   Immunization History  Administered Date(s) Administered  . Pneumococcal Conjugate-13 05/07/2014  . Pneumococcal Polysaccharide-23 11/25/2012  . Td 11/04/2014  . Tdap 08/28/2006     He  has a past medical history of Kidney stone and Cataract.    Medications reviewed and updated by myself where necessary, and exist elsewhere in the encounter.   Jeffrey Hull has no allergies on file. He  reports that he has quit smoking. His smokeless tobacco use includes Chew. He reports that he drinks about 1.8 oz of alcohol per week. He reports that he does not use illicit drugs. He  reports that he currently engages in sexual activity. He reports using the following method of birth control/protection: None. The patient  has past surgical history that includes Hernia repair; Colonoscopy; Shoulder open rotator cuff repair; Knee surgery; and Joint replacement.  His family history is negative for Colon cancer, Esophageal cancer, Stomach cancer, and Rectal cancer.  Review of Systems  Constitutional: Negative for fever and chills.  Respiratory: Negative for shortness of breath.   Cardiovascular: Negative for chest pain.  Gastrointestinal: Negative for nausea and abdominal pain.  Genitourinary: Negative.   Skin: Negative for rash.    Neurological: Negative for dizziness and headaches.    OBJECTIVE  His  height is 5' 9.5" (1.765 m) and weight is 205 lb 12.8 oz (93.35 kg). His oral temperature is 98.1 F (36.7 C). His blood pressure is 144/88 and his pulse is 79. His respiration is 16 and oxygen saturation is 98%.  The patient's body mass index is 29.97 kg/(m^2).  Physical Exam  Vitals reviewed. Constitutional: He is oriented to person, place, and time. He appears well-developed. No distress.  Eyes: EOM are normal. Pupils are equal, round, and reactive to light. No scleral icterus.  Neck: Normal range of motion.  Cardiovascular: Normal rate and regular rhythm.   Respiratory: Effort normal and breath sounds normal.  GI: Soft. Bowel sounds are normal. He exhibits no distension.  Musculoskeletal: Normal range of motion.  Neurological: He is alert and oriented to person, place, and time. No cranial nerve deficit.  Skin: Skin is warm and dry. No rash noted. He is not diaphoretic.     Psychiatric: He has a normal mood and affect.    Visual Acuity Screening   Right eye Left eye Both eyes  Without correction:     With correction: 20/30 20/25 20/20      No results found for this or any previous visit (from the past 24 hour(s)).  ASSESSMENT & PLAN  Jeffrey Hull was seen today for annual exam.  Diagnoses and all orders for this visit:  Encounter for Medicare annual wellness exam: Patient doing wonderfully.  Given his BP today he will obtain 10 ambulatory measures and  will return these to me at 102 for evaluation. Advised he continue with his exercise regimen and return to clinic in 1 year or as needed.      Screening -     Lipid panel -     CBC -     Comprehensive metabolic panel -     POCT urinalysis dipstick -     Hepatitis B surface antibody -     Hepatitis B surface antigen -     Hemoglobin A1C -     PSA, Medicare  Rash and nonspecific skin eruption -     Ambulatory referral to Dermatology    The patient  was advised to call or come back to clinic if he does not see an improvement in symptoms, or worsens with the above plan.   Philis Fendt, MHS, PA-C [facility] 12/20/2014 2:41 PM

## 2014-12-21 LAB — HEPATITIS B SURFACE ANTIBODY, QUANTITATIVE: Hepatitis B-Post: 1000 m[IU]/mL

## 2014-12-21 LAB — HEMOGLOBIN A1C
Hgb A1c MFr Bld: 6.1 % — ABNORMAL HIGH (ref <5.7)
Mean Plasma Glucose: 128 mg/dL — ABNORMAL HIGH (ref <117)

## 2014-12-21 LAB — PSA, MEDICARE: PSA: 1.41 ng/mL (ref <=4.00)

## 2014-12-21 LAB — HEPATITIS B SURFACE ANTIGEN: Hepatitis B Surface Ag: NEGATIVE

## 2014-12-29 ENCOUNTER — Encounter (HOSPITAL_COMMUNITY): Payer: Self-pay | Admitting: *Deleted

## 2014-12-29 ENCOUNTER — Emergency Department (HOSPITAL_COMMUNITY)
Admission: EM | Admit: 2014-12-29 | Discharge: 2014-12-29 | Disposition: A | Discharge status: Home / Self Care | Payer: Medicare Other | Attending: Emergency Medicine | Admitting: Emergency Medicine

## 2014-12-29 DIAGNOSIS — Z8669 Personal history of other diseases of the nervous system and sense organs: Secondary | ICD-10-CM | POA: Diagnosis not present

## 2014-12-29 DIAGNOSIS — I10 Essential (primary) hypertension: Secondary | ICD-10-CM | POA: Insufficient documentation

## 2014-12-29 DIAGNOSIS — Z87442 Personal history of urinary calculi: Secondary | ICD-10-CM | POA: Insufficient documentation

## 2014-12-29 DIAGNOSIS — R42 Dizziness and giddiness: Secondary | ICD-10-CM | POA: Insufficient documentation

## 2014-12-29 HISTORY — DX: Essential (primary) hypertension: I10

## 2014-12-29 LAB — BASIC METABOLIC PANEL
Anion gap: 8 (ref 5–15)
BUN: 6 mg/dL (ref 6–20)
CALCIUM: 9.6 mg/dL (ref 8.9–10.3)
CO2: 29 mmol/L (ref 22–32)
CREATININE: 1.46 mg/dL — AB (ref 0.61–1.24)
Chloride: 104 mmol/L (ref 101–111)
GFR calc Af Amer: 56 mL/min — ABNORMAL LOW (ref >60)
GFR, EST NON AFRICAN AMERICAN: 48 mL/min — AB (ref >60)
GLUCOSE: 105 mg/dL — AB (ref 65–99)
POTASSIUM: 4.4 mmol/L (ref 3.5–5.1)
SODIUM: 141 mmol/L (ref 135–145)

## 2014-12-29 LAB — CBC
HCT: 45.5 % (ref 39.0–52.0)
Hemoglobin: 15 g/dL (ref 13.0–17.0)
MCH: 27.3 pg (ref 26.0–34.0)
MCHC: 33 g/dL (ref 30.0–36.0)
MCV: 82.7 fL (ref 78.0–100.0)
PLATELETS: 241 10*3/uL (ref 150–400)
RBC: 5.5 MIL/uL (ref 4.22–5.81)
RDW: 14.6 % (ref 11.5–15.5)
WBC: 4.5 10*3/uL (ref 4.0–10.5)

## 2014-12-29 MED ORDER — MECLIZINE HCL 12.5 MG PO TABS: 12.5000 mg | ORAL_TABLET | Freq: Three times a day (TID) | ORAL | Status: DC | PRN

## 2014-12-29 NOTE — ED Notes (Addendum)
Pt reports driving down the road approx 2 hours ago and had episode of dizziness that lasted less than 1 minute. Reports it felt "like everything was spinning around." denies n/v. Reports that symptoms have improved pta. No neuro deficits noted at this time.

## 2014-12-29 NOTE — Discharge Instructions (Signed)
Benign Positional Vertigo Vertigo means you feel like you or your surroundings are moving when they are not. Benign positional vertigo is the most common form of vertigo. Benign means that the cause of your condition is not serious. Benign positional vertigo is more common in older adults. CAUSES  Benign positional vertigo is the result of an upset in the labyrinth system. This is an area in the middle ear that helps control your balance. This may be caused by a viral infection, head injury, or repetitive motion. However, often no specific cause is found. SYMPTOMS  Symptoms of benign positional vertigo occur when you move your head or eyes in different directions. Some of the symptoms may include:  Loss of balance and falls.  Vomiting.  Blurred vision.  Dizziness.  Nausea.  Involuntary eye movements (nystagmus). DIAGNOSIS  Benign positional vertigo is usually diagnosed by physical exam. If the specific cause of your benign positional vertigo is unknown, your caregiver may perform imaging tests, such as magnetic resonance imaging (MRI) or computed tomography (CT). TREATMENT  Your caregiver may recommend movements or procedures to correct the benign positional vertigo. Medicines such as meclizine, benzodiazepines, and medicines for nausea may be used to treat your symptoms. In rare cases, if your symptoms are caused by certain conditions that affect the inner ear, you may need surgery. HOME CARE INSTRUCTIONS   Follow your caregiver's instructions.  Move slowly. Do not make sudden body or head movements.  Avoid driving.  Avoid operating heavy machinery.  Avoid performing any tasks that would be dangerous to you or others during a vertigo episode.  Drink enough fluids to keep your urine clear or pale yellow. SEEK IMMEDIATE MEDICAL CARE IF:   You develop problems with walking, weakness, numbness, or using your arms, hands, or legs.  You have difficulty speaking.  You develop  severe headaches.  Your nausea or vomiting continues or gets worse.  You develop visual changes.  Your family or friends notice any behavioral changes.  Your condition gets worse.  You have a fever.  You develop a stiff neck or sensitivity to light. MAKE SURE YOU:   Understand these instructions.  Will watch your condition.  Will get help right away if you are not doing well or get worse. Document Released: 01/29/2006 Document Revised: 07/16/2011 Document Reviewed: 01/11/2011 ExitCare Patient Information 2015 [employer]. This information is not intended to replace advice given to you by your health care provider. Make sure you discuss any questions you have with your health care provider.    

## 2014-12-29 NOTE — ED Notes (Signed)
Resident at the bedside

## 2014-12-29 NOTE — ED Provider Notes (Signed)
CSN: [id number]     Arrival date & time 12/29/14  1639 History   First MD Initiated Contact with Patient 12/29/14 1817     Chief Complaint  Patient presents with  . Dizziness     (Consider location/radiation/quality/duration/timing/severity/associated sxs/prior Treatment) Patient is a 69 y.o. male presenting with dizziness. The history is provided by the patient. No language interpreter was used.  Dizziness Quality:  Room spinning Severity:  Mild Onset quality:  Sudden Duration: 1 minute. 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 Ineffective treatments:  None tried Associated symptoms: no blood in stool, no chest pain, no diarrhea, no headaches, no hearing loss, no nausea, no palpitations, no shortness of breath, no syncope, no tinnitus, no vision changes, no vomiting and no weakness   Risk factors: no anemia, no heart disease, no hx of stroke, no hx of vertigo, no Meniere's disease, no multiple medications and no new medications     Past Medical History  Diagnosis Date  . Kidney stone   . Cataract   . Hypertension    Past Surgical History  Procedure Laterality Date  . Hernia repair    . Colonoscopy    . Shoulder open rotator cuff repair      both shoulders  . Knee surgery      both knees  . Joint replacement     Family History  Problem Relation Age of Onset  . Colon cancer Neg Hx   . Esophageal cancer Neg Hx   . Stomach cancer Neg Hx   . Rectal cancer Neg Hx    Social History  Substance Use Topics  . Smoking status: Never Smoker   . Smokeless tobacco: Current User    Types: Chew  . Alcohol Use: No    Review of Systems  Constitutional: Negative for fever and chills.  HENT: Negative for ear discharge, ear pain, hearing loss  and tinnitus.   Eyes: Negative for photophobia and visual disturbance.  Respiratory: Negative for shortness of breath.   Cardiovascular: Negative for chest pain, palpitations and syncope.  Gastrointestinal: Negative for nausea, vomiting, diarrhea and blood in stool.  Neurological: Positive for dizziness (as described in HPI). Negative for weakness and headaches.  All other systems reviewed and are negative.     Allergies  Review of patient's allergies indicates no known allergies.  Home Medications   Prior to Admission medications   Medication Sig Start Date End Date Taking? Authorizing Provider  meclizine (ANTIVERT) 12.5 MG tablet Take 1 tablet (12.5 mg total) by mouth 3 (three) times daily as needed for dizziness. 12/29/14   Theodosia Quay, MD   BP 136/91 mmHg  Pulse 62  Temp(Src) 98.6 F (37 C) (Oral)  Resp 18  SpO2 98% Physical Exam  Constitutional: He is oriented to person, place, and time. No distress.  HENT:  Head: Normocephalic and atraumatic.  Eyes: Conjunctivae and EOM are normal.  Neck: Normal range of motion. Neck supple.  Cardiovascular: Normal rate, regular rhythm and normal heart sounds.   No murmur heard. Pulmonary/Chest: Effort normal and breath sounds normal. No respiratory distress. He has no wheezes.  Abdominal: Soft. He exhibits no distension. There is no tenderness. There is no guarding.  Musculoskeletal: Normal range of motion. He exhibits no tenderness.  Neurological:  He is alert and oriented to person, place, and time. He has normal strength. He displays no tremor. No cranial nerve deficit (CN II-XII intact) or sensory deficit (sensation intact throughout). Coordination (normal gait) normal. GCS eye subscore is 4. GCS verbal subscore is 5. GCS motor subscore is 6.  Reflex Scores:      Patellar reflexes are 1+ on the right side and 1+ on the left side. Negative Dix-hallpike, 5/5 strength in upper and lower ext bilaterally   Skin: Skin is warm and dry. He  is not diaphoretic.  Psychiatric: He has a normal mood and affect. His behavior is normal.    ED Course  Procedures (including critical care time) Labs Review Labs Reviewed  BASIC METABOLIC PANEL - Abnormal; Notable for the following:    Glucose, Bld 105 (*)    Creatinine, Ser 1.46 (*)    GFR calc non Af Amer 48 (*)    GFR calc Af Amer 56 (*)    All other components within normal limits  CBC    Imaging Review No results found. I have personally reviewed and evaluated these images and lab results as part of my medical decision-making.   EKG Interpretation   Date/Time:  Wednesday December 29 2014 17:43:28 EDT Ventricular Rate:  73 PR Interval:  160 QRS Duration: 78 QT Interval:  366 QTC Calculation: 403 R Axis:   31 Text Interpretation:  Normal sinus rhythm Normal ECG No old tracing to  compare Confirmed by [facility]  MD, DAVID ([id number]) on 12/29/2014 7:02:31 PM      MDM   Final diagnoses:  Vertigo    Patient presents today with transient episode of dizziness that occurred approximately 2 hours PTA while driving car. Denies headache, fever, chills. Spontaneous resolution and clear pattern of room spinning to patient's left sounds more consistent with peripheral etiology. No focal neuro deficits, no prior history of strokes, central vertigo seems less likely. Vital signs are stable. No ischemic changes noted on EKG, no chest pain today, no prior history of CAD -- cardiac etiology also seems unlikely as well. Will discharge patient home with prescription for meclizine as needed over next few days for symptom control and follow-up with PCP as needed. Pt is agreeable with this plan.     Theodosia Quay, MD 80/32/12 2482  Delora Fuel, MD 50/03/70 [id number]

## 2014-12-30 ENCOUNTER — Encounter: Payer: Self-pay | Admitting: Physician Assistant

## 2015-01-11 LAB — COLOGUARD

## 2016-05-11 ENCOUNTER — Ambulatory Visit (INDEPENDENT_AMBULATORY_CARE_PROVIDER_SITE_OTHER): Payer: Medicare Other | Admitting: Physician Assistant

## 2016-05-11 ENCOUNTER — Ambulatory Visit (INDEPENDENT_AMBULATORY_CARE_PROVIDER_SITE_OTHER): Payer: Medicare Other

## 2016-05-11 VITALS — BP 122/72 | HR 73 | Temp 98.3°F | Resp 17 | Ht 69.5 in | Wt 196.0 lb

## 2016-05-11 DIAGNOSIS — G8929 Other chronic pain: Secondary | ICD-10-CM | POA: Diagnosis not present

## 2016-05-11 DIAGNOSIS — M25561 Pain in right knee: Secondary | ICD-10-CM

## 2016-05-11 DIAGNOSIS — M79661 Pain in right lower leg: Secondary | ICD-10-CM

## 2016-05-11 DIAGNOSIS — M1612 Unilateral primary osteoarthritis, left hip: Secondary | ICD-10-CM

## 2016-05-11 MED ORDER — MELOXICAM 7.5 MG PO TABS: 7.5000 mg | ORAL_TABLET | Freq: Every day | ORAL | 0 refills | Status: AC

## 2016-05-11 NOTE — Progress Notes (Signed)
Janette Douros  MRN: [id number] DOB: 08-22-1945  Subjective:  Pt presents to clinic with right knee pain for the last 2 days.  The pain was there when he woke up in the am - he had no injury.    The pain is more on the lateral aspect of his knee and it feels like a dull pain that changes to sharp when he goes to stand and put weight on it.  He has been using motrin and that helps the pain slightly.  He has had surgery in the past on that knee to have it cleaned out and he knows that he has arthritis in that knee.  No pain without movement.    Review of Systems  Musculoskeletal: Positive for gait problem (2nd to pain).    Patient Active Problem List   Diagnosis Date Noted  . Obesity 08/09/2014  . Tobacco dependency 08/09/2014    No current outpatient prescriptions on file prior to visit.   No current facility-administered medications on file prior to visit.     No Known Allergies  Pt patients past, family and social history were reviewed and updated.   Objective:  BP 122/72 (BP Location: Right Arm, Patient Position: Sitting, Cuff Size: Normal)   Pulse 73   Temp 98.3 F (36.8 C) (Oral)   Resp 17   Ht 5' 9.5" (1.765 m)   Wt 196 lb (88.9 kg)   SpO2 100%   BMI 28.53 kg/m   Physical Exam  Constitutional: He is oriented to person, place, and time and well-developed, well-nourished, and in no distress.  HENT:  Head: Normocephalic and atraumatic.  Right Ear: External ear normal.  Left Ear: External ear normal.  Eyes: Conjunctivae are normal.  Neck: Normal range of motion.  Pulmonary/Chest: Effort normal.  Musculoskeletal:       Right knee: He exhibits normal range of motion, no LCL laxity, no bony tenderness, normal meniscus and no MCL laxity. Tenderness found. Lateral joint line tenderness noted.       Left knee: Normal.       Right lower leg: He exhibits tenderness (area on the right shin - TTP without defect palpable). He exhibits no swelling.       Legs: No calf TTP  or palpable mass or cord.  Increase pain in the shin with ankle plantar flexion - with dorsiflexion of ankle increased pain with resistance  Neurological: He is alert and oriented to person, place, and time. Gait normal.  Skin: Skin is warm and dry.  Psychiatric: Mood, memory, affect and judgment normal.   Dg Tibia/fibula Right  Result Date: 05/11/2016 CLINICAL DATA:  Right lower leg pain.  Pain in the right she an. EXAM: RIGHT TIBIA AND FIBULA - 2 VIEW COMPARISON:  Right knee radiographs 09/30/2007 FINDINGS: Progressive degenerative changes are noted in the right knee. The tibia and fibula are otherwise unremarkable. No acute bone or soft tissue abnormalities are present. The ankle joint is unremarkable. Vascular calcifications are noted. IMPRESSION: 1. Progressive degenerative changes within the right knee. 2. No acute abnormality or focal lesion to explain the patient's pain otherwise. Electronically Signed   By: San Morelle M.D.   On: 05/11/2016 10:48   Dg Knee Complete 4 Views Right  Result Date: 05/11/2016 CLINICAL DATA:  Chronic right knee pain. EXAM: RIGHT KNEE - COMPLETE 4+ VIEW COMPARISON:  Right knee radiographs 09/30/2007 FINDINGS: The right knee is located. Progressive degenerative changes are present. Degenerative changes are most noted within the medial  and patellofemoral compartments. Atherosclerotic calcifications are present. IMPRESSION: 1. Progressive tricompartmental degenerative changes, most notable in the medial compartment. 2. No acute abnormality. Electronically Signed   By: San Morelle M.D.   On: 05/11/2016 10:49    Assessment and Plan :  Chronic pain of right knee - Plan: DG Knee Complete 4 Views Right  Pain in right shin - Plan: DG Tibia/Fibula Right - pt given exercises to decrease the discomfort  Primary osteoarthritis of left hip - Plan: meloxicam (MOBIC) 7.5 MG tablet - pt has worsening arthritis and we discussed that he is currently not using on  NSAIDs so we will start that and depending on his results he may want a referral to ortho to discuss options but he wants to wait currently because he has not had trouble with until recently  Windell Hummingbird PA-C  [facility] 05/11/2016 11:03 AM

## 2016-05-11 NOTE — Patient Instructions (Addendum)
IF you received an x-ray today, you will receive an invoice from [facility]. Please contact [facility] at [phone] with questions or concerns regarding your invoice.   IF you received labwork today, you will receive an invoice from [facility]. Please contact [facility] at [phone] with questions or concerns regarding your invoice.   Our billing staff will not be able to assist you with questions regarding bills from these companies.  You will be contacted with the lab results as soon as they are available. The fastest way to get your results is to activate your My Chart account. Instructions are located on the last page of this paperwork. If you have not heard from [place] regarding the results in 2 weeks, please contact this office.     Shin Splints Shin splints is a painful condition that is felt in the front, lower parts of the legs. Muscles, cordlike structures that connect muscles to bones (tendons), and the thin layer that covers the shin bone get irritated (inflamed). It can be caused by activities or exercises that are demanding. It may also be caused by sports that have sudden starts and stops. Follow these instructions at home: Injury care  If told, put ice on the injured area.  Put ice in a plastic bag.  Place a towel between your skin and the bag.  Leave the ice on for 20 minutes, 2-3 times per day.  Massage, stretch, and strengthen the shin area. Activity  Rest as needed. Return to activity slowly as told by your doctor.  Do not use your shins to support your body weight when you start exercising again. Try cycling or swimming.  Stop running if you have pain.  Warm up before you exercise.  Run on a flat and firm surface, if possible.  Change the intensity of your exercise slowly.  Increase your running distance slowly. For example, if you are running 5 miles this week, you should only add  mile to your run next week. General  instructions  Take medicines only as told by your doctor.  Wear shoes that have good arch support and heel support. Your shoes should cushion and support you as you walk or run.  Change your athletic shoes every 6 months, or every 350-450 miles. Contact a doctor if:  Your problems continue or they get worse even with treatment.  Your pain spreads, is worse, or changes over time.  You have swelling in your lower leg that gets worse.  Your shin is red and feels warm. Get help right away if:  You have very bad pain.  You have trouble walking. This information is not intended to replace advice given to you by your health care provider. Make sure you discuss any questions you have with your health care provider. Document Released: 01/03/2011 Document Revised: 12/17/2015 Document Reviewed: 04/19/2014 Elsevier Interactive Patient Education  2017 [place]   Arthritis Introduction Arthritis means joint pain. It can also mean joint disease. A joint is a place where bones come together. People who have arthritis may have:  Red joints.  Swollen joints.  Stiff joints.  Warm joints.  A fever.  A feeling of being sick. Follow these instructions at home: Pay attention to any changes in your symptoms. Take these actions to help with your pain and swelling. Medicines  Take over-the-counter and prescription medicines only as told by your doctor.  Do not take aspirin for pain if your doctor says that you may have gout. Activity  Rest your joint if your doctor tells you to.  Avoid activities that make the pain worse.  Exercise your joint regularly as told by your doctor. Try doing exercises like:  Swimming.  Water aerobics.  Biking.  Walking. Joint Care   If your joint is swollen, keep it raised (elevated) if told by your doctor.  If your joint feels stiff in the morning, try taking a warm shower.  If you have diabetes, do not apply heat without asking your  doctor.  If told, apply heat to the joint:  Put a towel between the joint and the hot pack or heating pad.  Leave the heat on the area for 20-30 minutes.  If told, apply ice to the joint:  Put ice in a plastic bag.  Place a towel between your skin and the bag.  Leave the ice on for 20 minutes, 2-3 times per day.  Keep all follow-up visits as told by your doctor. Contact a doctor if:  The pain gets worse.  You have a fever. Get help right away if:  You have very bad pain in your joint.  You have swelling in your joint.  Your joint is red.  Many joints become painful and swollen.  You have very bad back pain.  Your leg is very weak.  You cannot control your pee (urine) or poop (stool). This information is not intended to replace advice given to you by your health care provider. Make sure you discuss any questions you have with your health care provider. Document Released: 07/18/2009 Document Revised: 09/29/2015 Document Reviewed: 07/19/2014   Ganglion Cyst Introduction A ganglion cyst is a noncancerous, fluid-filled lump that occurs near joints or tendons. The ganglion cyst grows out of a joint or the lining of a tendon. It most often develops in the hand or wrist, but it can also develop in the shoulder, elbow, hip, knee, ankle, or foot. The round or oval ganglion cyst can be the size of a pea or larger than a grape. Increased activity may enlarge the size of the cyst because more fluid starts to build up. What are the causes? It is not known what causes a ganglion cyst to grow. However, it may be related to:  Inflammation or irritation around the joint.  An injury.  Repetitive movements or overuse.  Arthritis. What increases the risk? Risk factors include:  Being a woman.  Being age 55-50. What are the signs or symptoms? Symptoms may include:  A lump. This most often appears on the hand or wrist, but it can occur in other areas of the  body.  Tingling.  Pain.  Numbness.  Muscle weakness.  Weak grip.  Less movement in a joint. How is this diagnosed? Ganglion cysts are most often diagnosed based on a physical exam. Your health care provider will feel the lump and may shine a light alongside it. If it is a ganglion cyst, a light often shines through it. Your health care provider may order an X-ray, ultrasound, or MRI to rule out other conditions. How is this treated? Ganglion cysts usually go away on their own without treatment. If pain or other symptoms are involved, treatment may be needed. Treatment is also needed if the ganglion cyst limits your movement or if it gets infected. Treatment may include:  Wearing a brace or splint on your wrist or finger.  Taking anti-inflammatory medicine.  Draining fluid from the lump with a needle (aspiration).  Injecting a steroid into the joint.  Surgery to remove the ganglion cyst. Follow these instructions at home:  Do not press on the ganglion cyst, poke it with a needle, or hit it.  Take medicines only as directed by your health care provider.  Wear your brace or splint as directed by your health care provider.  Watch your ganglion cyst for any changes.  Keep all follow-up visits as directed by your health care provider. This is important. Contact a health care provider if:  Your ganglion cyst becomes larger or more painful.  You have increased redness, red streaks, or swelling.  You have pus coming from the lump.  You have weakness or numbness in the affected area.  You have a fever or chills. This information is not intended to replace advice given to you by your health care provider. Make sure you discuss any questions you have with your health care provider. Document Released: 04/20/2000 Document Revised: 09/29/2015 Document Reviewed: 10/06/2013  2017 Elsevier   2017 Elsevier

## 2016-05-28 ENCOUNTER — Ambulatory Visit (INDEPENDENT_AMBULATORY_CARE_PROVIDER_SITE_OTHER): Payer: Medicare Other | Admitting: Physician Assistant

## 2016-05-28 ENCOUNTER — Encounter: Payer: Self-pay | Admitting: Physician Assistant

## 2016-05-28 VITALS — BP 132/77 | HR 92 | Temp 99.3°F | Ht 69.5 in | Wt 197.0 lb

## 2016-05-28 DIAGNOSIS — Z0000 Encounter for general adult medical examination without abnormal findings: Secondary | ICD-10-CM | POA: Diagnosis not present

## 2016-05-28 DIAGNOSIS — N181 Chronic kidney disease, stage 1: Secondary | ICD-10-CM | POA: Diagnosis not present

## 2016-05-28 DIAGNOSIS — Z1389 Encounter for screening for other disorder: Secondary | ICD-10-CM

## 2016-05-28 DIAGNOSIS — D235 Other benign neoplasm of skin of trunk: Secondary | ICD-10-CM | POA: Diagnosis not present

## 2016-05-28 DIAGNOSIS — Z13220 Encounter for screening for lipoid disorders: Secondary | ICD-10-CM | POA: Diagnosis not present

## 2016-05-28 DIAGNOSIS — Z130 Encounter for screening for diseases of the blood and blood-forming organs and certain disorders involving the immune mechanism: Secondary | ICD-10-CM | POA: Diagnosis not present

## 2016-05-28 DIAGNOSIS — Z1329 Encounter for screening for other suspected endocrine disorder: Secondary | ICD-10-CM

## 2016-05-28 DIAGNOSIS — M546 Pain in thoracic spine: Secondary | ICD-10-CM | POA: Diagnosis not present

## 2016-05-28 DIAGNOSIS — Z1159 Encounter for screening for other viral diseases: Secondary | ICD-10-CM

## 2016-05-28 DIAGNOSIS — Z131 Encounter for screening for diabetes mellitus: Secondary | ICD-10-CM | POA: Diagnosis not present

## 2016-05-28 DIAGNOSIS — H6121 Impacted cerumen, right ear: Secondary | ICD-10-CM | POA: Diagnosis not present

## 2016-05-28 DIAGNOSIS — N182 Chronic kidney disease, stage 2 (mild): Secondary | ICD-10-CM | POA: Insufficient documentation

## 2016-05-28 DIAGNOSIS — D225 Melanocytic nevi of trunk: Secondary | ICD-10-CM

## 2016-05-28 NOTE — Progress Notes (Signed)
05/29/2016 1:46 PM   DOB: July 06, 1945 / MRN: [id number]  SUBJECTIVE:  Jeffrey Hull is a 71 y.o. male with a history of HTN presenting for a physical.  He walks a mile daily (30 daily) and likes to lift weights. Last colonoscopy was 4 years ago and he was advised to return in 5 years at that time. He takes no medications. He denies a history of diabetes and HTN. He denies a family history of prostate cancer. Negative for smoking. Does not want an HIV test.   He complains of a two week history of mid thoracic back pain that started after moving some heavy objects.  Denies weakness, paresthesia, bowel and bladder changes. Feels that he is improving.   Immunization History  Administered Date(s) Administered  . Influenza-Unspecified 02/18/2015, 01/24/2016  . Pneumococcal Conjugate-13 05/07/2014  . Pneumococcal Polysaccharide-23 11/25/2012  . Td 11/04/2014  . Tdap 08/28/2006   He has No Known Allergies.   He  has a past medical history of Cataract; Hypertension; and Kidney stone.    He  reports that he has never smoked. His smokeless tobacco use includes Chew. He reports that he does not drink alcohol or use drugs. He  reports that he currently engages in sexual activity. He reports using the following method of birth control/protection: None. The patient  has a past surgical history that includes Hernia repair; Colonoscopy; Shoulder open rotator cuff repair; Knee surgery; and Joint replacement.  His family history is not on file.  Review of Systems  Constitutional: Negative for chills and fever.  Respiratory: Negative for cough.   Gastrointestinal: Negative for nausea.  Genitourinary: Negative for dysuria.  Skin: Negative for itching and rash.  Neurological: Negative for dizziness.  Psychiatric/Behavioral: Negative for depression.    The problem list and medications were reviewed and updated by myself where necessary and exist elsewhere in the encounter.   OBJECTIVE:  BP 132/77 (BP  Location: Right Arm, Patient Position: Sitting, Cuff Size: Small)   Pulse 92   Temp 99.3 F (37.4 C) (Oral)   Ht 5' 9.5" (1.765 m)   Wt 197 lb (89.4 kg)   SpO2 97%   BMI 28.67 kg/m   Physical Exam  Constitutional: He is oriented to person, place, and time. He appears well-developed. He does not appear ill.  Eyes: Conjunctivae and EOM are normal. Pupils are equal, round, and reactive to light.  Cardiovascular: Normal rate and regular rhythm.   Pulmonary/Chest: Effort normal and breath sounds normal.  Abdominal: He exhibits no distension.  Musculoskeletal: Normal range of motion. He exhibits no edema, tenderness or deformity.       Thoracic back: He exhibits normal range of motion, no tenderness, no bony tenderness, no swelling and no edema.       Back:  Neurological: He is alert and oriented to person, place, and time. No cranial nerve deficit. Coordination normal.  Skin: Skin is warm and dry. He is not diaphoretic.  Psychiatric: He has a normal mood and affect.  Nursing note and vitals reviewed.   Lab Results  Component Value Date   CREATININE 1.19 05/28/2016   BUN 8 05/28/2016   NA 143 05/28/2016   K 4.4 05/28/2016   CL 102 05/28/2016   CO2 23 05/28/2016   Lab Results  Component Value Date   HGBA1C 5.6 05/28/2016   Lab Results  Component Value Date   PSA 1.41 12/20/2014   PSA 1.35 09/27/2013   PSA 1.70 11/25/2012   Lab Results  Component  Value Date   ALT 20 05/28/2016   AST 29 05/28/2016   ALKPHOS 72 05/28/2016   BILITOT 0.4 05/28/2016   Lab Results  Component Value Date   WBC 3.8 05/28/2016   HGB 15.0 12/29/2014   HCT 44.2 05/28/2016   MCV 81 05/28/2016   PLT 238 05/28/2016   Lab Results  Component Value Date   CHOL 178 05/28/2016   HDL 49 05/28/2016   LDLCALC 105 (H) 05/28/2016   TRIG 121 05/28/2016   CHOLHDL 3.6 05/28/2016        Results for orders placed or performed in visit on 05/28/16 (from the past 72 hour(s))  CBC     Status: Abnormal    Collection Time: 05/28/16  2:31 PM  Result Value Ref Range   WBC 3.8 3.4 - 10.8 x10E3/uL   RBC 5.45 4.14 - 5.80 x10E6/uL   Hemoglobin 14.4 13.0 - 17.7 g/dL   Hematocrit 44.2 37.5 - 51.0 %   MCV 81 79 - 97 fL   MCH 26.4 (L) 26.6 - 33.0 pg   MCHC 32.6 31.5 - 35.7 g/dL   RDW 15.7 (H) 12.3 - 15.4 %   Platelets 238 150 - 379 x10E3/uL  CMP14+EGFR     Status: Abnormal   Collection Time: 05/28/16  2:31 PM  Result Value Ref Range   Glucose 113 (H) 65 - 99 mg/dL   BUN 8 8 - 27 mg/dL   Creatinine, Ser 1.19 0.76 - 1.27 mg/dL   GFR calc non Af Amer 62 >59 mL/min/1.73   GFR calc Af Amer 72 >59 mL/min/1.73   BUN/Creatinine Ratio 7 (L) 10 - 24   Sodium 143 134 - 144 mmol/L   Potassium 4.4 3.5 - 5.2 mmol/L   Chloride 102 96 - 106 mmol/L   CO2 23 18 - 29 mmol/L   Calcium 9.6 8.6 - 10.2 mg/dL   Total Protein 7.4 6.0 - 8.5 g/dL   Albumin 4.6 3.6 - 4.8 g/dL   Globulin, Total 2.8 1.5 - 4.5 g/dL   Albumin/Globulin Ratio 1.6 1.2 - 2.2   Bilirubin Total 0.4 0.0 - 1.2 mg/dL   Alkaline Phosphatase 72 39 - 117 IU/L   AST 29 0 - 40 IU/L   ALT 20 0 - 44 IU/L  Lipid panel     Status: Abnormal   Collection Time: 05/28/16  2:31 PM  Result Value Ref Range   Cholesterol, Total 178 100 - 199 mg/dL   Triglycerides 121 0 - 149 mg/dL   HDL 49 >39 mg/dL   VLDL Cholesterol Cal 24 5 - 40 mg/dL   LDL Calculated 105 (H) 0 - 99 mg/dL   Chol/HDL Ratio 3.6 0.0 - 5.0 ratio units    Comment:                                   T. Chol/HDL Ratio                                             Men  Women                               1/2 Avg.Risk  3.4    3.3  Avg.Risk  5.0    4.4                                2X Avg.Risk  9.6    7.1                                3X Avg.Risk 23.4   11.0   TSH     Status: None   Collection Time: 05/28/16  2:31 PM  Result Value Ref Range   TSH 1.430 0.450 - 4.500 uIU/mL  Hemoglobin A1c     Status: None   Collection Time: 05/28/16  2:31 PM  Result  Value Ref Range   Hgb A1c MFr Bld 5.6 4.8 - 5.6 %    Comment:          Pre-diabetes: 5.7 - 6.4          Diabetes: >6.4          Glycemic control for adults with diabetes: <7.0    Est. average glucose Bld gHb Est-mCnc 114 mg/dL  Hepatitis C antibody     Status: None   Collection Time: 05/28/16  2:31 PM  Result Value Ref Range   Hep C Virus Ab <0.1 0.0 - 0.9 s/co ratio    Comment:                                   Negative:     < 0.8                              Indeterminate: 0.8 - 0.9                                   Positive:     > 0.9  The CDC recommends that a positive HCV antibody result  be followed up with a HCV Nucleic Acid Amplification  test (825003).       ASSESSMENT AND PLAN:  Regis was seen today for annual exam and shoulder pain.  Diagnoses and all orders for this visit:  Annual physical exam: History of mild CKD however improved on today's labs. We will need to be careful with regard to kidney toxic medications with this patient in the future.   Screening for deficiency anemia -     CBC  Screening for nephropathy -     CMP14+EGFR  Screening for lipid disorders -     Lipid panel  Screening for thyroid disorder -     TSH  Screening for diabetes mellitus -     Hemoglobin A1c  Need for hepatitis C screening test -     Hepatitis C antibody  Right ear impacted cerumen -     Ear wax removal  Nevus of multiple sites of trunk -     Ambulatory referral to Dermatology  Stage 1 chronic kidney disease: If stable will monitor for medications that may worsen this. No HTN or diabetes at this time.     The patient is advised to call or return to clinic if he does not see an improvement in symptoms, or to seek the care of the closest emergency department if he worsens with the  above plan.   Philis Fendt, MHS, PA-C [facility] 05/29/2016 1:46 PM

## 2016-05-28 NOTE — Patient Instructions (Addendum)
Take 1000 mg of tylenol every 8 hours for pain.  Come back in two weeks if your not improving.      IF you received an x-ray today, you will receive an invoice from [facility]. Please contact [facility] at [phone] with questions or concerns regarding your invoice.   IF you received labwork today, you will receive an invoice from [facility]. Please contact [facility] at [phone] with questions or concerns regarding your invoice.   Our billing staff will not be able to assist you with questions regarding bills from these companies.  You will be contacted with the lab results as soon as they are available. The fastest way to get your results is to activate your My Chart account. Instructions are located on the last page of this paperwork. If you have not heard from [place] regarding the results in 2 weeks, please contact this office.

## 2016-05-29 ENCOUNTER — Telehealth: Payer: Self-pay

## 2016-05-29 LAB — CBC
HEMOGLOBIN: 14.4 g/dL (ref 13.0–17.7)
Hematocrit: 44.2 % (ref 37.5–51.0)
MCH: 26.4 pg — ABNORMAL LOW (ref 26.6–33.0)
MCHC: 32.6 g/dL (ref 31.5–35.7)
MCV: 81 fL (ref 79–97)
Platelets: 238 10*3/uL (ref 150–379)
RBC: 5.45 x10E6/uL (ref 4.14–5.80)
RDW: 15.7 % — ABNORMAL HIGH (ref 12.3–15.4)
WBC: 3.8 10*3/uL (ref 3.4–10.8)

## 2016-05-29 LAB — CMP14+EGFR
ALBUMIN: 4.6 g/dL (ref 3.6–4.8)
ALT: 20 IU/L (ref 0–44)
AST: 29 IU/L (ref 0–40)
Albumin/Globulin Ratio: 1.6 (ref 1.2–2.2)
Alkaline Phosphatase: 72 IU/L (ref 39–117)
BUN / CREAT RATIO: 7 — AB (ref 10–24)
BUN: 8 mg/dL (ref 8–27)
Bilirubin Total: 0.4 mg/dL (ref 0.0–1.2)
CALCIUM: 9.6 mg/dL (ref 8.6–10.2)
CO2: 23 mmol/L (ref 18–29)
CREATININE: 1.19 mg/dL (ref 0.76–1.27)
Chloride: 102 mmol/L (ref 96–106)
GFR calc Af Amer: 72 mL/min/{1.73_m2} (ref >59)
GFR, EST NON AFRICAN AMERICAN: 62 mL/min/{1.73_m2} (ref >59)
Globulin, Total: 2.8 g/dL (ref 1.5–4.5)
Glucose: 113 mg/dL — ABNORMAL HIGH (ref 65–99)
Potassium: 4.4 mmol/L (ref 3.5–5.2)
Sodium: 143 mmol/L (ref 134–144)
Total Protein: 7.4 g/dL (ref 6.0–8.5)

## 2016-05-29 LAB — HEPATITIS C ANTIBODY: Hep C Virus Ab: <0.1 s/co ratio (ref 0.0–0.9)

## 2016-05-29 LAB — LIPID PANEL
CHOL/HDL RATIO: 3.6 ratio (ref 0.0–5.0)
Cholesterol, Total: 178 mg/dL (ref 100–199)
HDL: 49 mg/dL (ref >39)
LDL CALC: 105 mg/dL — AB (ref 0–99)
TRIGLYCERIDES: 121 mg/dL (ref 0–149)
VLDL CHOLESTEROL CAL: 24 mg/dL (ref 5–40)

## 2016-05-29 LAB — TSH: TSH: 1.43 u[IU]/mL (ref 0.450–4.500)

## 2016-05-29 LAB — HEMOGLOBIN A1C
Est. average glucose Bld gHb Est-mCnc: 114 mg/dL
Hgb A1c MFr Bld: 5.6 % (ref 4.8–5.6)

## 2016-05-29 NOTE — Telephone Encounter (Signed)
Contacted patient and left on authorized voicemail that he is to pick up at the pharmacy otc tylenol 1000mg  q8 hours for pain.

## 2016-05-29 NOTE — Telephone Encounter (Signed)
Pt saw michael clark yesterday and he thought he was getting a rx for tylenol for his back but nothing was sent to the pharmacy  Please call pt   Best number [phone]

## 2016-11-26 ENCOUNTER — Ambulatory Visit: Payer: Medicare Other | Admitting: Physician Assistant

## 2017-06-26 IMAGING — CR DG OS CALCIS 2+V*R*
2 series · 2 of 2 positions shown · non-contrast
Comparison: None.

CLINICAL DATA: Puncture wound

EXAM:
RIGHT OS CALCIS - 2+ VIEW

[axial]
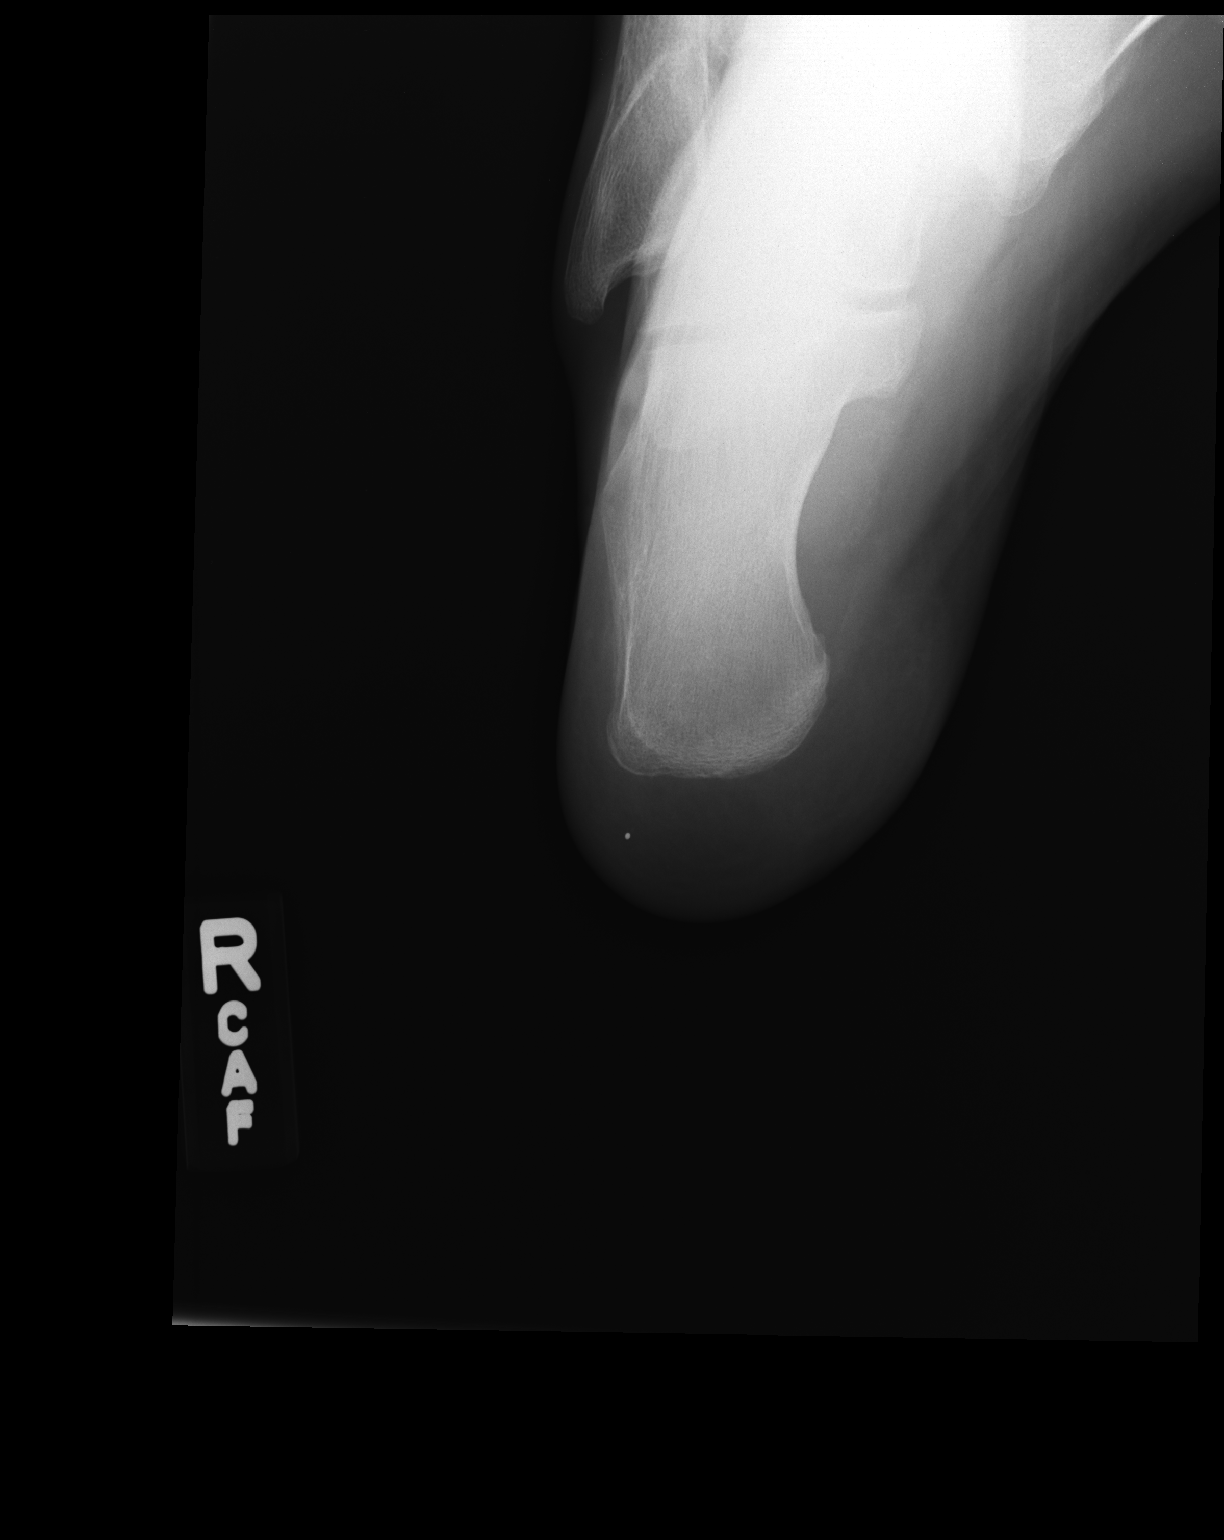

[lateral]
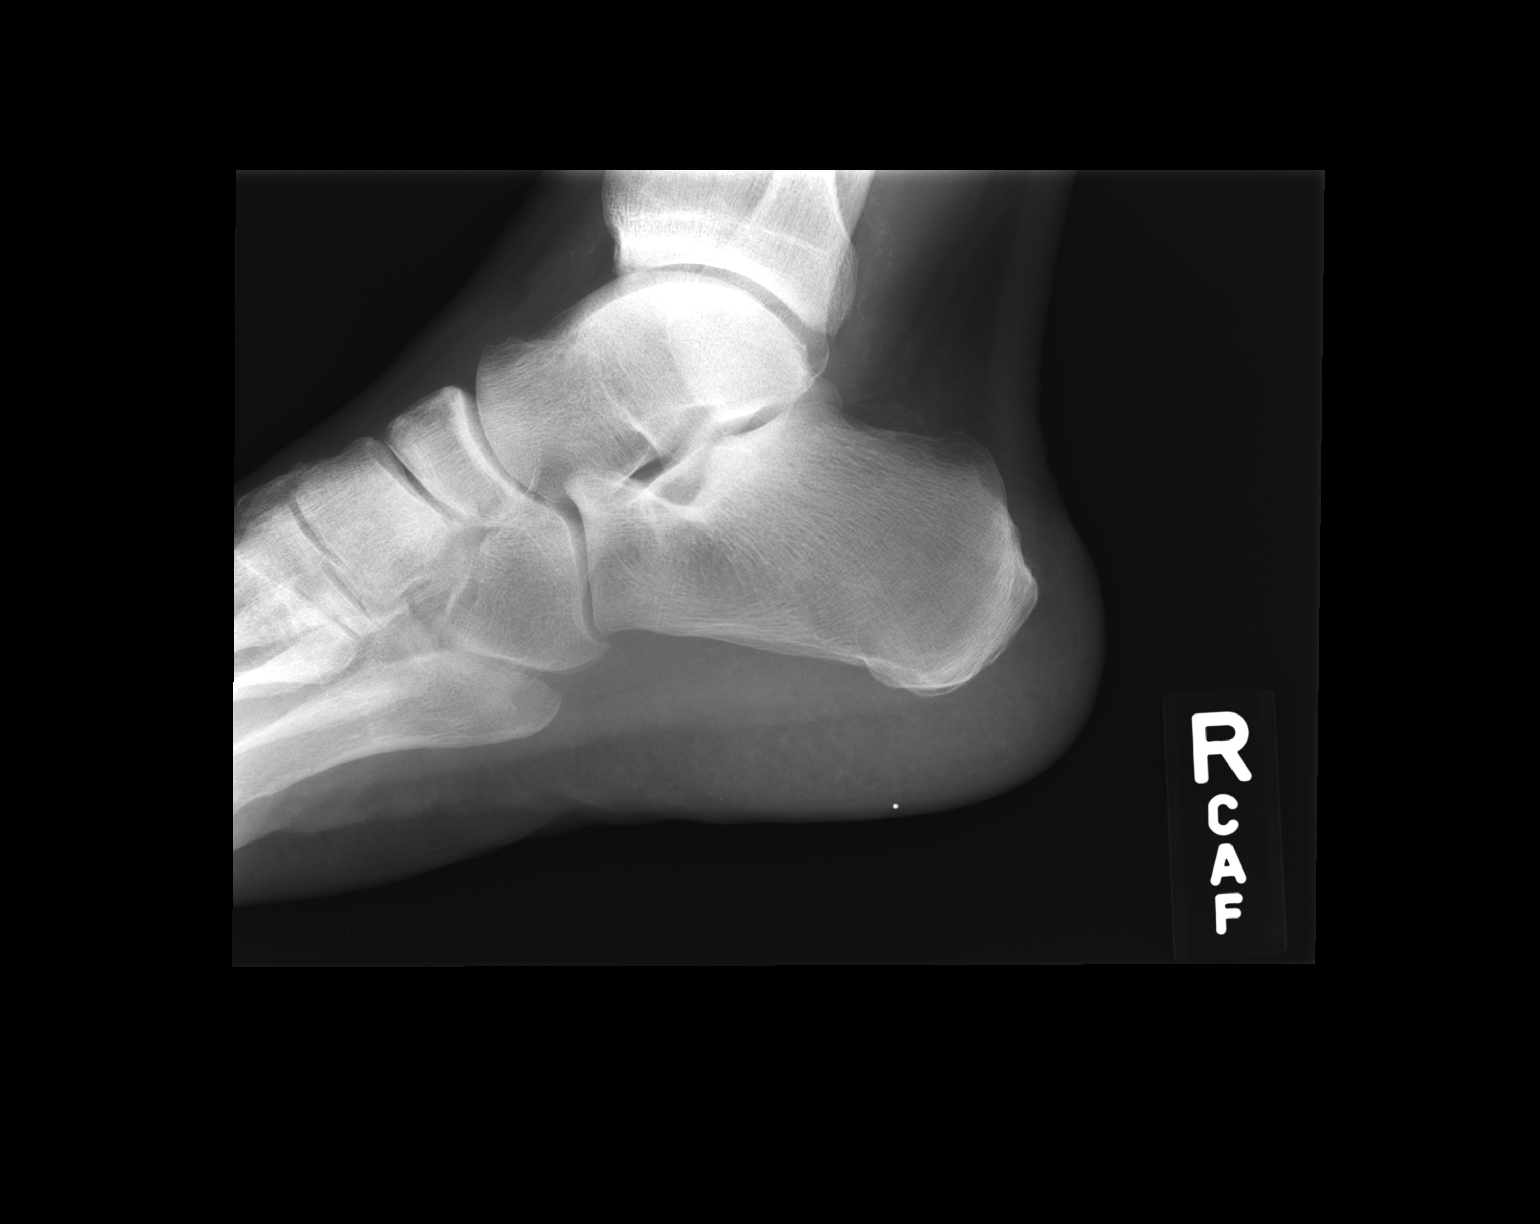

[2 of 2 positions shown; findings below may reference images not displayed]

FINDINGS: Lateral and [place] views were obtained. There is a small metallic
structure in the superficial soft tissues volar to the posterior
calcaneus. Question whether a marker was placed at the site of the
puncture wound. If a marker was not placed, this small radiopaque
structure represents a foreign body.

No fracture or dislocation. No soft tissue air. Joint spaces appear
intact. No erosive change.
IMPRESSION: Small, approximately 2 mm, metallic foreign body in the superficial
soft tissues volar to the posterior calcaneus. This finding is
consistent with a small foreign body within the superficial soft
tissues. Question whether marker was placed in this area. If marker
was not placed in this area, then this structure is a foreign body
which is likely related to the recent trauma. No fracture or
dislocation. No appreciable arthropathy.

## 2018-11-26 ENCOUNTER — Encounter: Payer: Self-pay | Admitting: Gastroenterology

## 2019-07-05 ENCOUNTER — Ambulatory Visit: Payer: Medicare Other | Attending: Internal Medicine

## 2019-07-05 DIAGNOSIS — Z23 Encounter for immunization: Secondary | ICD-10-CM

## 2019-07-05 NOTE — Progress Notes (Signed)
   Covid-19 Vaccination Clinic  Name:  Jeffrey Hull    MRN: [id number] DOB: 01/06/1946  07/05/2019  [name] was observed post Covid-19 immunization for 15 minutes without incidence. He was provided with Vaccine Information Sheet and instruction to access the V-Safe system.   [name] was instructed to call 911 with any severe reactions post vaccine: Marland Kitchen Difficulty breathing  . Swelling of your face and throat  . A fast heartbeat  . A bad rash all over your body  . Dizziness and weakness    Immunizations Administered    Name Date Dose VIS Date Route   Pfizer COVID-19 Vaccine 07/05/2019  2:56 PM 0.3 mL 04/17/2019 Intramuscular   Manufacturer: [place]   Lot: [id number]   [place]: [id number]

## 2019-08-04 ENCOUNTER — Ambulatory Visit: Payer: Medicare Other | Attending: Internal Medicine

## 2019-08-04 DIAGNOSIS — Z23 Encounter for immunization: Secondary | ICD-10-CM

## 2019-08-04 NOTE — Progress Notes (Signed)
   Covid-19 Vaccination Clinic  Name:  Jeffrey Hull    MRN: [id number] DOB: 1946-02-02  08/04/2019  [name] was observed post Covid-19 immunization for 15 minutes without incident. He was provided with Vaccine Information Sheet and instruction to access the V-Safe system.   [name] was instructed to call 911 with any severe reactions post vaccine: Marland Kitchen Difficulty breathing  . Swelling of face and throat  . A fast heartbeat  . A bad rash all over body  . Dizziness and weakness   Immunizations Administered    Name Date Dose VIS Date Route   Pfizer COVID-19 Vaccine 08/04/2019  8:37 AM 0.3 mL 04/17/2019 Intramuscular   Manufacturer: [place]   Lot: [id number]   [place]: [id number]

## 2020-10-13 ENCOUNTER — Other Ambulatory Visit: Payer: Self-pay | Admitting: Student

## 2020-10-13 ENCOUNTER — Ambulatory Visit
Admission: RE | Admit: 2020-10-13 | Discharge: 2020-10-13 | Disposition: A | Discharge status: Home / Self Care | Payer: Medicare Other | Source: Ambulatory Visit | Attending: Student | Admitting: Student

## 2020-10-13 ENCOUNTER — Other Ambulatory Visit: Payer: Self-pay

## 2020-10-13 DIAGNOSIS — R590 Localized enlarged lymph nodes: Secondary | ICD-10-CM

## 2020-10-19 ENCOUNTER — Other Ambulatory Visit: Payer: Self-pay | Admitting: Student

## 2020-10-19 DIAGNOSIS — R0989 Other specified symptoms and signs involving the circulatory and respiratory systems: Secondary | ICD-10-CM

## 2020-10-24 ENCOUNTER — Other Ambulatory Visit: Payer: Self-pay | Admitting: Student

## 2020-10-28 ENCOUNTER — Other Ambulatory Visit: Payer: Self-pay | Admitting: Student

## 2020-10-28 DIAGNOSIS — R590 Localized enlarged lymph nodes: Secondary | ICD-10-CM

## 2020-10-31 ENCOUNTER — Ambulatory Visit
Admission: RE | Admit: 2020-10-31 | Discharge: 2020-10-31 | Disposition: A | Discharge status: Home / Self Care | Payer: Medicare Other | Source: Ambulatory Visit | Attending: Student | Admitting: Student

## 2020-10-31 DIAGNOSIS — R0989 Other specified symptoms and signs involving the circulatory and respiratory systems: Secondary | ICD-10-CM

## 2020-11-04 ENCOUNTER — Ambulatory Visit
Admission: RE | Admit: 2020-11-04 | Discharge: 2020-11-04 | Disposition: A | Discharge status: Home / Self Care | Payer: Medicare Other | Source: Ambulatory Visit | Attending: Student | Admitting: Student

## 2020-11-04 DIAGNOSIS — R590 Localized enlarged lymph nodes: Secondary | ICD-10-CM

## 2020-11-04 MED ORDER — GADOBENATE DIMEGLUMINE 529 MG/ML IV SOLN
20.0000 mL | Freq: Once | INTRAVENOUS | Status: AC | PRN
  Administered 2020-11-04: 20 mL via INTRAVENOUS

## 2021-02-05 ENCOUNTER — Encounter: Payer: Self-pay | Admitting: Gastroenterology

## 2021-12-28 ENCOUNTER — Other Ambulatory Visit: Payer: Self-pay | Admitting: Student

## 2021-12-28 ENCOUNTER — Ambulatory Visit
Admission: RE | Admit: 2021-12-28 | Discharge: 2021-12-28 | Disposition: A | Discharge status: Home / Self Care | Payer: Medicare Other | Source: Ambulatory Visit | Attending: Student | Admitting: Student

## 2021-12-28 DIAGNOSIS — S99921A Unspecified injury of right foot, initial encounter: Secondary | ICD-10-CM

## 2022-05-25 ENCOUNTER — Ambulatory Visit: Payer: Medicare Other | Admitting: Podiatry

## 2022-05-25 ENCOUNTER — Ambulatory Visit (INDEPENDENT_AMBULATORY_CARE_PROVIDER_SITE_OTHER): Payer: Medicare Other

## 2022-05-25 DIAGNOSIS — S90851A Superficial foreign body, right foot, initial encounter: Secondary | ICD-10-CM

## 2022-05-25 DIAGNOSIS — M79671 Pain in right foot: Secondary | ICD-10-CM | POA: Diagnosis not present

## 2022-05-25 NOTE — Progress Notes (Signed)
   Chief Complaint  Patient presents with   Foot Injury    Patient came in today for right foot injury, 4th toe, stepped on a piece of wood, started 3 months ago, swelling, rate of pain 2 out of 10, X-rays done today     HPI: 77 y.o. male presenting today as a new patient for evaluation of an injury that was sustained this past summer.  Patient states that he stepped on a piece of wood which penetrated his foot and fourth toe.  He went to the doctor and x-rays were taken which were negative for foreign body.  They told him it should heal uneventfully.  Since that time he has had low-grade pain ever since then.  He has also noticed a small abscess/mass around the base of the fourth toe of the right foot.  He presents for further treatment and evaluation  Past Medical History:  Diagnosis Date   Cataract    Hypertension    Kidney stone     Past Surgical History:  Procedure Laterality Date   COLONOSCOPY     HERNIA REPAIR     JOINT REPLACEMENT     KNEE SURGERY     both knees   SHOULDER OPEN ROTATOR CUFF REPAIR     both shoulders    No Known Allergies     Physical Exam: General: The patient is alert and oriented x3 in no acute distress.  Dermatology: Skin is warm, dry and supple bilateral lower extremities. Negative for open lesions or macerations.   Vascular: Palpable pedal pulses bilaterally. Capillary refill within normal limits.  Negative for any significant edema or erythema  Neurological: Light touch and protective threshold grossly intact  Musculoskeletal Exam: No pedal deformities noted.  Soft tissue mass noted at the dorsal lateral base of the fourth digit right foot.  There is some associated tenderness with palpation.  Radiographic Exam RT foot 05/25/2022:  Normal osseous mineralization. Joint spaces preserved. No fracture/dislocation/boney destruction.    Assessment: 1.  Suspect foreign body abscess base of the right fourth digit   Plan of Care:  1. Patient  evaluated. X-Rays reviewed.  2.  MRI ordered today.  Patient will likely need excision of the soft tissue mass around the base of the fourth digit but MRI required prior to better visualize the mass and identify any foreign body 3.  Return to clinic after MRI to schedule possible excision of the mass/abscess 4.  In the meantime recommend good supportive shoes advised against going barefoot OTC Motrin as needed      Edrick Kins, DPM [facility]  Dr. Edrick Kins, DPM    [address], [place] [address]                Office [phone]  Fax [phone]

## 2022-06-10 ENCOUNTER — Ambulatory Visit
Admission: RE | Admit: 2022-06-10 | Discharge: 2022-06-10 | Disposition: A | Discharge status: Home / Self Care | Payer: Medicare Other | Source: Ambulatory Visit | Attending: Podiatry | Admitting: Podiatry

## 2022-06-10 DIAGNOSIS — S90851A Superficial foreign body, right foot, initial encounter: Secondary | ICD-10-CM

## 2022-06-12 ENCOUNTER — Other Ambulatory Visit: Payer: Medicare Other

## 2022-06-15 ENCOUNTER — Ambulatory Visit (INDEPENDENT_AMBULATORY_CARE_PROVIDER_SITE_OTHER): Payer: Medicare Other | Admitting: Podiatry

## 2022-06-15 ENCOUNTER — Telehealth: Payer: Self-pay | Admitting: Urology

## 2022-06-15 DIAGNOSIS — S90851A Superficial foreign body, right foot, initial encounter: Secondary | ICD-10-CM | POA: Diagnosis not present

## 2022-06-15 NOTE — Progress Notes (Signed)
Chief Complaint  Patient presents with   Follow-up    MRI follow-up right fourth toe. Patient denies any pain at this time.     HPI: 77 y.o. male presenting today for follow-up evaluation of an injury that was sustained this past summer.  Patient states that he stepped on a piece of wood which penetrated his foot and fourth toe.  He went to the doctor and x-rays were taken which were negative for foreign body.  They told him it should heal uneventfully.  Since that time he has had low-grade pain ever since then.  He has also noticed a small abscess/mass around the base of the fourth toe of the right foot.    Last visit on 05/25/2022 MRI was ordered positive for foreign body within the soft tissue of the foot.  He presents today to review the MRI and for further treatment and evaluation  Past Medical History:  Diagnosis Date   Cataract    Hypertension    Kidney stone     Past Surgical History:  Procedure Laterality Date   COLONOSCOPY     HERNIA REPAIR     JOINT REPLACEMENT     KNEE SURGERY     both knees   SHOULDER OPEN ROTATOR CUFF REPAIR     both shoulders    No Known Allergies    05/25/2022 RT foot  Physical Exam: General: The patient is alert and oriented x3 in no acute distress.  Dermatology: Skin is warm, dry and supple bilateral lower extremities. Negative for open lesions or macerations.   Vascular: Palpable pedal pulses bilaterally. Capillary refill within normal limits.  Negative for any significant edema or erythema  Neurological: Light touch and protective threshold grossly intact  Musculoskeletal Exam: No pedal deformities noted.  Soft tissue mass noted at the dorsal lateral base of the fourth digit right foot.  There is some associated tenderness with palpation.  Radiographic Exam RT foot 05/25/2022:  Normal osseous mineralization. Joint spaces preserved. No fracture/dislocation/boney destruction.    MR TOES RIGHT WO CONTRAST 06/15/2022: IMPRESSION: 1. A 7  mm linear foreign body in the dorsal soft tissues between the fourth and fifth MTP joints with surrounding soft tissue edema which may reflect foreign body reaction versus cellulitis. 2. No acute osseous injury of the right foot.  Assessment: 1.  foreign body abscess base of the right fourth digit   Plan of Care:  1. Patient evaluated.  MRI reviewed.  2.  After discussing the MRI I do believe it is appropriate to remove the foreign body within the foot and debride/irrigate the surrounding soft tissue.  I would like to have this performed in a more sterile setting versus in the office.  Risk benefits advantages and disadvantages of this procedure were explained in detail to the patient.  No guarantees were expressed or implied.  The patient agrees 3.  Authorization for surgery was initiated today.  Surgery will consist of excision of foreign body right foot.  Incision and drainage right foot. 4.  Return to clinic 1 week postop      Edrick Kins, DPM [facility]  Dr. Edrick Kins, DPM    [address], [place] [address]  Office [phone]  Fax [phone]

## 2022-06-15 NOTE — Telephone Encounter (Signed)
DOS - 07/12/22  INCISION AND DRAINAGE OF ABSCESS RIGHT --- 10061 EXC FOREIGN BODY RIGHT --- 28020  [facility] EFFECTIVE DATE - 05/07/22   PER [employer] WEBSITE FOR CPT CODES 16109 AND 60454 Notification or Prior Authorization is not required for the requested services You are not required to submit a notification/prior authorization based on the information provided. If you have general questions about the prior authorization requirements, visit [email or web address] > Clinician Resources > Advance and Admission Notification Requirements. The number above acknowledges your notification. Please write this reference number down for future reference. If you would like to request an organization determination, please call us at [phone].   Decision ID #: [id number]

## 2022-07-10 ENCOUNTER — Other Ambulatory Visit: Payer: Self-pay | Admitting: Podiatry

## 2022-07-10 MED ORDER — DOXYCYCLINE HYCLATE 100 MG PO TABS: 100.0000 mg | ORAL_TABLET | Freq: Two times a day (BID) | ORAL | 0 refills | Status: AC

## 2022-07-12 ENCOUNTER — Other Ambulatory Visit: Payer: Self-pay | Admitting: Podiatry

## 2022-07-12 ENCOUNTER — Encounter: Payer: Self-pay | Admitting: Podiatry

## 2022-07-12 DIAGNOSIS — S90851A Superficial foreign body, right foot, initial encounter: Secondary | ICD-10-CM

## 2022-07-12 MED ORDER — OXYCODONE-ACETAMINOPHEN 5-325 MG PO TABS: 1.0000 | ORAL_TABLET | ORAL | 0 refills | Status: AC | PRN

## 2022-07-12 NOTE — Progress Notes (Signed)
PRN postop 

## 2022-07-18 ENCOUNTER — Ambulatory Visit (INDEPENDENT_AMBULATORY_CARE_PROVIDER_SITE_OTHER): Payer: Medicare Other | Admitting: Podiatry

## 2022-07-18 ENCOUNTER — Ambulatory Visit (INDEPENDENT_AMBULATORY_CARE_PROVIDER_SITE_OTHER): Payer: Medicare Other

## 2022-07-18 DIAGNOSIS — Z9889 Other specified postprocedural states: Secondary | ICD-10-CM | POA: Diagnosis not present

## 2022-07-18 NOTE — Progress Notes (Signed)
   Chief Complaint  Patient presents with   Routine Post Op    POV # 1 DOS 07/12/22 --- REMOVAL OF FOREIGN BODY RIGHT FOOT, INCISION AND DRAINAGE RIGHT FOOT Patient states right foot is getting better since the last visit . Patient states he has seen discharge whenever he has applied pressure to the toe but it hasn't been alot    Subjective:  Patient presents today status post removal of foreign body of the right foot with incision and drainage.  DOS: 6 07/12/2022.  Patient is doing very well.  He has no pain associated to the area.  He is wearing the surgical shoe as instructed  Past Medical History:  Diagnosis Date   Cataract    Hypertension    Kidney stone     Past Surgical History:  Procedure Laterality Date   COLONOSCOPY     HERNIA REPAIR     JOINT REPLACEMENT     KNEE SURGERY     both knees   SHOULDER OPEN ROTATOR CUFF REPAIR     both shoulders    No Known Allergies  Objective/Physical Exam Neurovascular status intact.  The removal of the foreign body area has healed nicely.  There is no drainage or erythema.  The area was left open after surgery to allow for secondary healing which appears to be healing appropriately   Assessment: 1. s/p removal of foreign body with incision and drainage right foot. DOS: 07/12/2022  -Patient evaluated.  Overall the small area appears to be healing nicely and appropriately -Patient may discontinue the postop shoe -Recommend good supportive sneakers.  Patient may slowly increase to full activity no restrictions -Return to clinic as needed   Edrick Kins, DPM [facility]  Dr. Edrick Kins, DPM    [address], [place] [address]                Office [phone]  Fax [phone]

## 2022-07-25 ENCOUNTER — Encounter: Payer: Medicare Other | Admitting: Podiatry

## 2022-08-08 ENCOUNTER — Encounter: Payer: Medicare Other | Admitting: Podiatry

## 2023-05-21 ENCOUNTER — Ambulatory Visit: Payer: Self-pay | Admitting: Surgery

## 2023-05-21 DIAGNOSIS — Z01818 Encounter for other preprocedural examination: Secondary | ICD-10-CM

## 2023-05-24 NOTE — Patient Instructions (Addendum)
SURGICAL WAITING ROOM VISITATION  Patients having surgery or a procedure may have no more than 2 support people in the waiting area - these visitors may rotate.    Children under the age of 98 must have an adult with them who is not the patient.  Due to an increase in RSV and influenza rates and associated hospitalizations, children ages 46 and under may not visit patients in [facility] hospitals.  Visitors with respiratory illnesses are discouraged from visiting and should remain at home.  If the patient needs to stay at the hospital during part of their recovery, the visitor guidelines for inpatient rooms apply. Pre-op nurse will coordinate an appropriate time for 1 support person to accompany patient in pre-op.  This support person may not rotate.    Please refer to the [facility] website for the visitor guidelines for Inpatients (after your surgery is over and you are in a regular room).       Your procedure is scheduled on: 06/07/23   Report to [facility] Main Entrance    Report to admitting at 8:45 AM   Call this number if you have problems the morning of surgery [phone]   Do not eat food or drink liquids :After Midnight.    Oral Hygiene is also important to reduce your risk of infection.                                    Remember - BRUSH YOUR TEETH THE MORNING OF SURGERY WITH YOUR REGULAR TOOTHPASTE  DENTURES WILL BE REMOVED PRIOR TO SURGERY PLEASE DO NOT APPLY "Poly grip" OR ADHESIVES!!!   Do NOT smoke after Midnight   Stop all vitamins and herbal supplements 7 days before surgery.   Take these medicines the morning of surgery with A SIP OF WATER: vibramycin, percocet if needed             You may not have any metal on your body including hair pins, jewelry, and body piercing             Do not wear make-up, lotions, powders, perfumes/cologne, or deodorant              Men may shave face and neck.   Do not bring valuables to the hospital. [facility] IS NOT             RESPONSIBLE   FOR VALUABLES.   Contacts, glasses, dentures or bridgework may not be worn into surgery.  DO NOT BRING YOUR HOME MEDICATIONS TO THE HOSPITAL. PHARMACY WILL DISPENSE MEDICATIONS LISTED ON YOUR MEDICATION LIST TO YOU DURING YOUR ADMISSION IN THE HOSPITAL!    Patients discharged on the day of surgery will not be allowed to drive home.  Someone NEEDS to stay with you for the first 24 hours after anesthesia.   Special Instructions: Bring a copy of your healthcare power of attorney and living will documents the day of surgery if you haven't scanned them before.              Please read over the following fact sheets you were given: IF YOU HAVE QUESTIONS ABOUT YOUR PRE-OP INSTRUCTIONS PLEASE CALL [phone] [facility]y [facility]   If you received a COVID test during your pre-op visit  it is requested that you wear a mask when out in public, stay away from anyone that may not be feeling well and notify your  surgeon if you develop symptoms. If you test positive for Covid or have been in contact with anyone that has tested positive in the last 10 days please notify you surgeon.    [place] - Preparing for Surgery Before surgery, you can play an important role.  Because skin is not sterile, your skin needs to be as free of germs as possible.  You can reduce the number of germs on your skin by washing with CHG (chlorahexidine gluconate) soap before surgery.  CHG is an antiseptic cleaner which kills germs and bonds with the skin to continue killing germs even after washing. Please DO NOT use if you have an allergy to CHG or antibacterial soaps.  If your skin becomes reddened/irritated stop using the CHG and inform your nurse when you arrive at Short Stay. Do not shave (including legs and underarms) for at least 48 hours prior to the first CHG shower.  You may shave your face/neck.  Please follow these instructions carefully:  1.  Shower with CHG Soap the night before surgery  and the  morning of surgery.  2.  If you choose to wash your hair, wash your hair first as usual with your normal  shampoo.  3.  After you shampoo, rinse your hair and body thoroughly to remove the shampoo.                             4.  Use CHG as you would any other liquid soap.  You can apply chg directly to the skin and wash.  Gently with a scrungie or clean washcloth.  5.  Apply the CHG Soap to your body ONLY FROM THE NECK DOWN.   Do   not use on face/ open                           Wound or open sores. Avoid contact with eyes, ears mouth and   genitals (private parts).                       Wash face,  Genitals (private parts) with your normal soap.             6.  Wash thoroughly, paying special attention to the area where your    surgery  will be performed.  7.  Thoroughly rinse your body with warm water from the neck down.  8.  DO NOT shower/wash with your normal soap after using and rinsing off the CHG Soap.                9.  Pat yourself dry with a clean towel.            10.  Wear clean pajamas.            11.  Place clean sheets on your bed the night of your first shower and do not  sleep with pets. Day of Surgery : Do not apply any lotions/deodorants the morning of surgery.  Please wear clean clothes to the hospital/surgery center.  FAILURE TO FOLLOW THESE INSTRUCTIONS MAY RESULT IN THE CANCELLATION OF YOUR SURGERY  PATIENT SIGNATURE_________________________________  NURSE SIGNATURE__________________________________  ________________________________________________________________________

## 2023-05-24 NOTE — Progress Notes (Signed)
COVID Vaccine received:  []  No []  Yes Date of any COVID positive Test in last 90 days:  PCP -  Cardiologist -   Chest x-ray -  EKG -   Stress Test -  ECHO -  Cardiac Cath -   Bowel Prep - []  No  []   Yes ______  Pacemaker / ICD device []  No []  Yes   Spinal Cord Stimulator:[]  No []  Yes       History of Sleep Apnea? []  No []  Yes   CPAP used?- []  No []  Yes    Does the patient monitor blood sugar?          []  No []  Yes  []  N/A  Patient has: []  NO Hx DM   []  Pre-DM                 []  DM1  []   DM2 Does patient have a [employer] or [employer]? []  No []  Yes   Fasting Blood Sugar Ranges-  Checks Blood Sugar _____ times a day  GLP1 agonist / usual dose -  GLP1 instructions:  SGLT-2 inhibitors / usual dose -  SGLT-2 instructions:   Blood Thinner / Instructions: Aspirin Instructions:  Comments:   Activity level: Patient is able / unable to climb a flight of stairs without difficulty; []  No CP  []  No SOB, but would have ___   Patient can / can not perform ADLs without assistance.   Anesthesia review:   Patient denies shortness of breath, fever, cough and chest pain at PAT appointment.  Patient verbalized understanding and agreement to the Pre-Surgical Instructions that were given to them at this PAT appointment. Patient was also educated of the need to review these PAT instructions again prior to his/her surgery.I reviewed the appropriate phone numbers to call if they have any and questions or concerns.

## 2023-05-27 ENCOUNTER — Encounter (HOSPITAL_COMMUNITY)
Admission: RE | Admit: 2023-05-27 | Discharge: 2023-05-27 | Disposition: A | Discharge status: Home / Self Care | Payer: Medicare Other | Source: Ambulatory Visit | Attending: Anesthesiology | Admitting: Anesthesiology

## 2023-05-27 DIAGNOSIS — I10 Essential (primary) hypertension: Secondary | ICD-10-CM

## 2023-05-27 DIAGNOSIS — Z01818 Encounter for other preprocedural examination: Secondary | ICD-10-CM

## 2023-06-07 ENCOUNTER — Ambulatory Visit (HOSPITAL_COMMUNITY): Admit: 2023-06-07 | Payer: Medicare Other | Admitting: Surgery

## 2023-06-07 SURGERY — REPAIR, HERNIA, UMBILICAL, ADULT
Anesthesia: General

## 2023-06-27 IMAGING — MR MR NECK WO/W CM
4 of 10 series · 19 of 48 positions shown · IV contrast (multihance)
Comparison: None.

CLINICAL DATA: Virchow node, marked with vitamin-E capsule for MRI

EXAM:
MRI OF THE NECK WITH CONTRAST
TECHNIQUE: Multiplanar, multisequence MR imaging was performed following the
administration of intravenous contrast.
CONTRAST:  20mL MULTIHANCE GADOBENATE DIMEGLUMINE 529 MG/ML IV SOLN

[Series 2: T1 · sagittal · 5.0mm · 0.49mm/px · 4 of 38 slices shown]
[im 1/38]
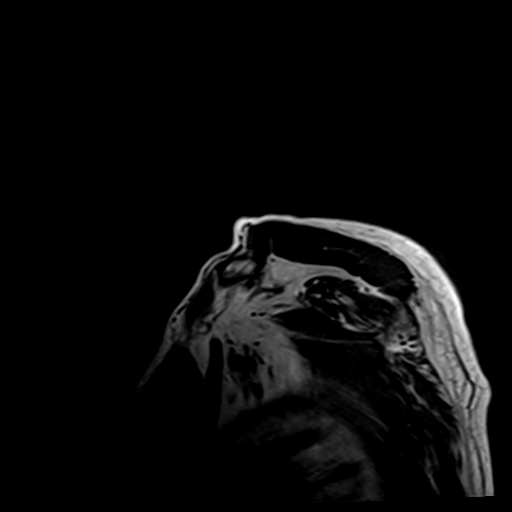
[im 10/38]
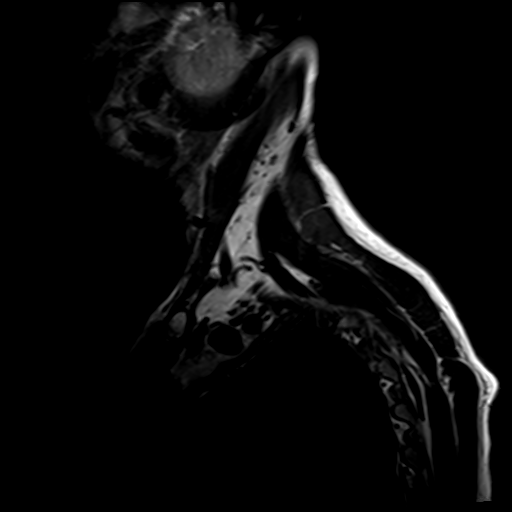
[im 19/38]
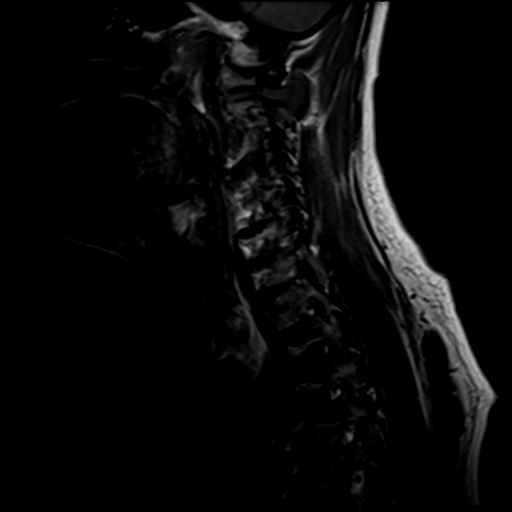
[im 38/38]
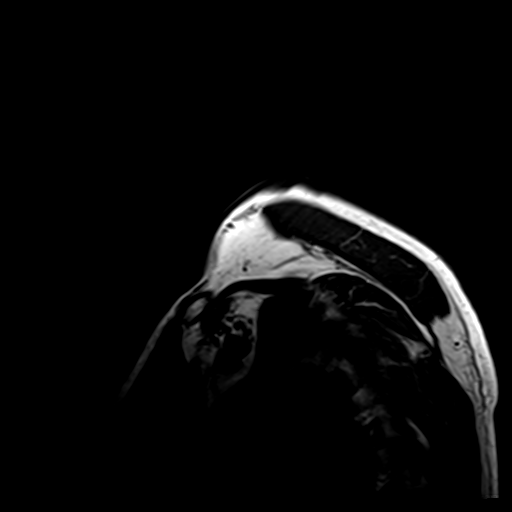

[Series 3: T2 fat-sat · sagittal · 5.0mm · 0.53mm/px · 5 of 38 slices shown (1 of 3)]
[im 1/38]
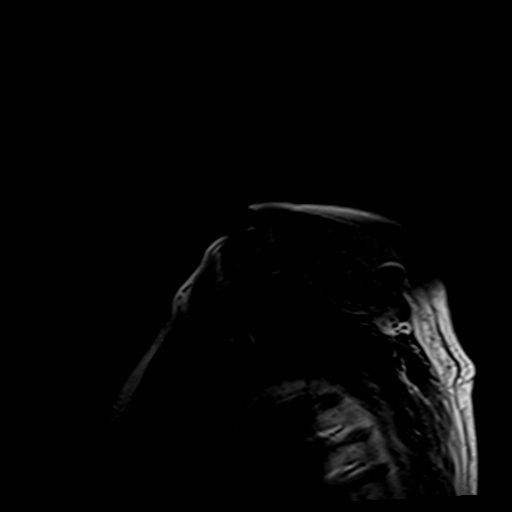
[im 10/38]
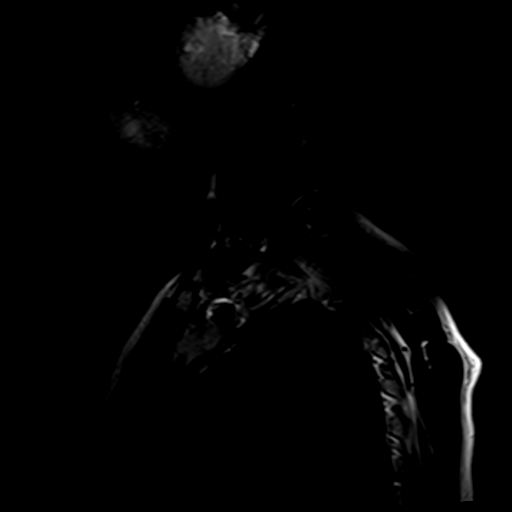
[im 19/38]
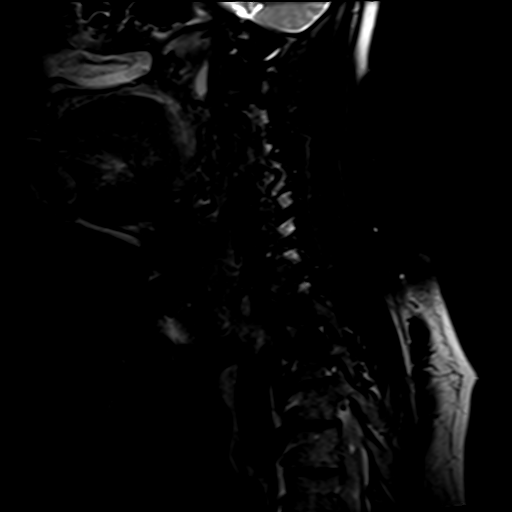
[im 28/38]
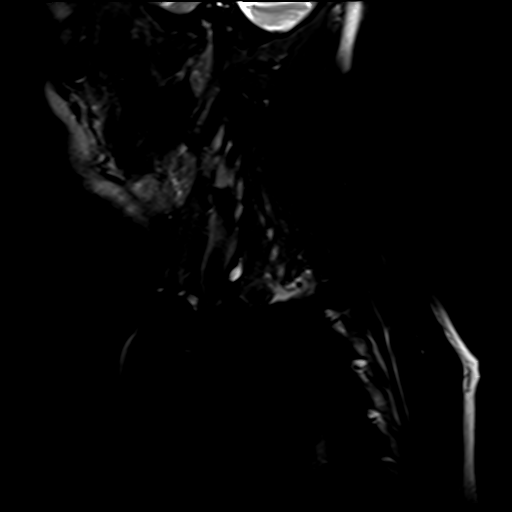
[im 38/38]
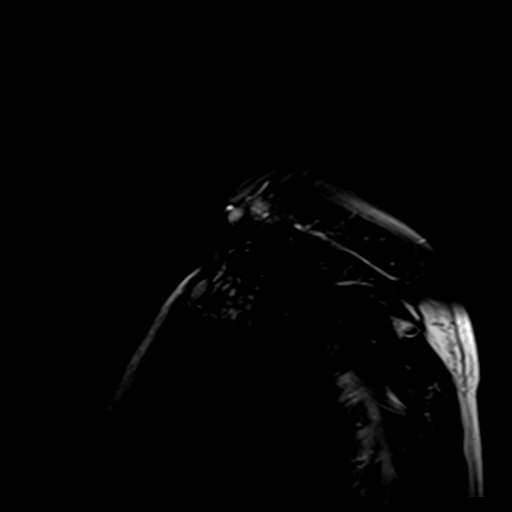

[Series 5: T2 fat-sat · coronal · 4.0mm · 0.53mm/px · 6 of 48 slices shown (2 of 3)]
[im 1/48]
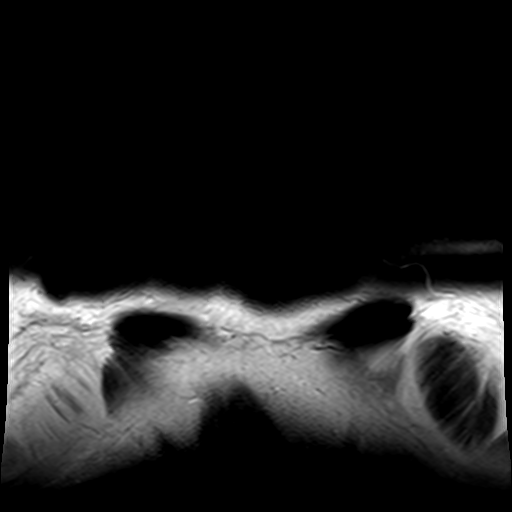
[im 10/48]
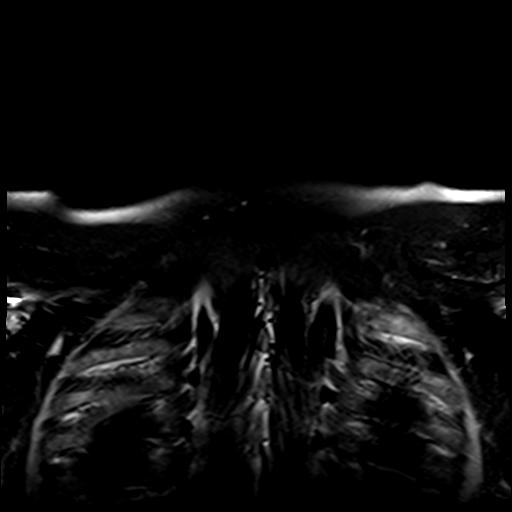
[im 19/48]
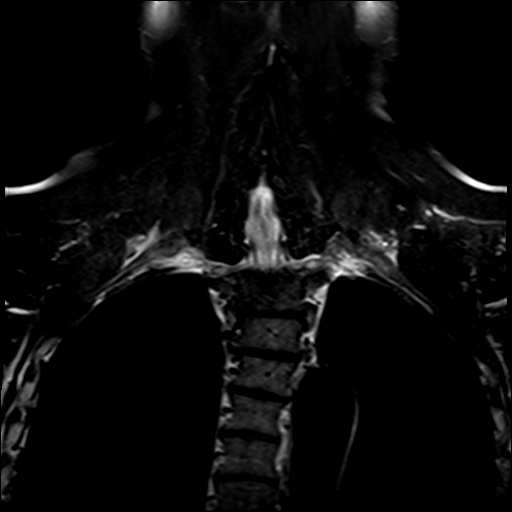
[im 29/48]
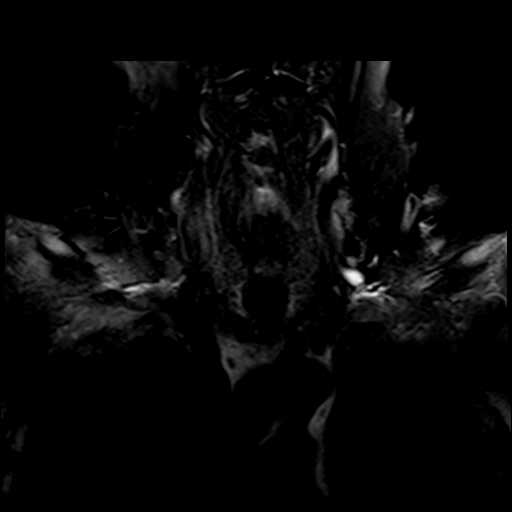
[im 38/48]
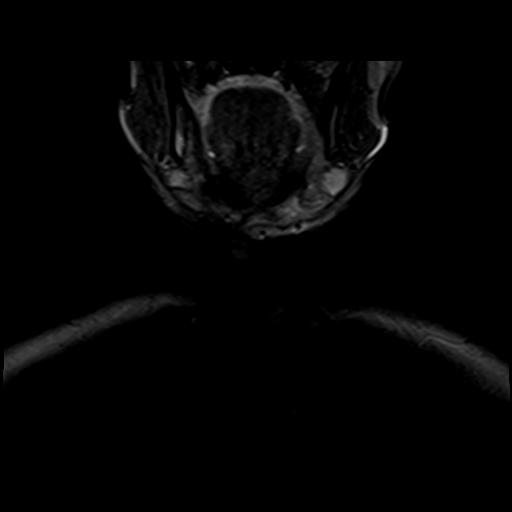
[im 48/48]
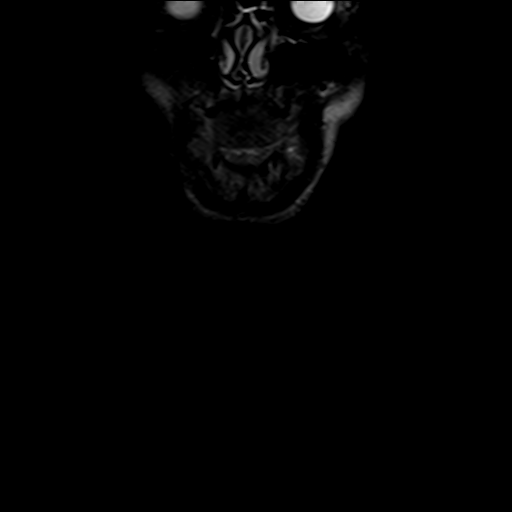

[Series 7: T2 fat-sat · axial · 5.0mm · 0.53mm/px · z∈[-127,+113]mm · 4 of 38 slices shown (3 of 3)]
[im 1/38]
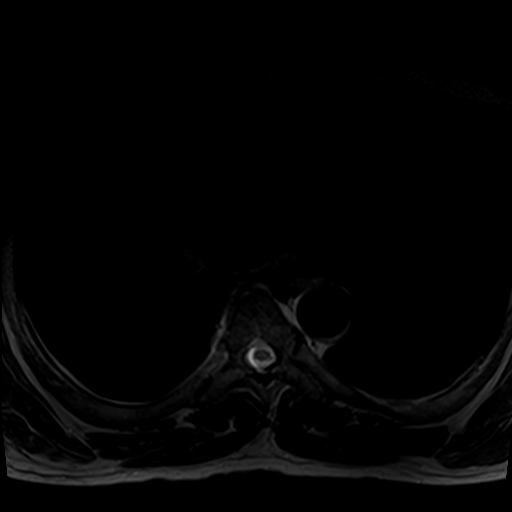
[im 13/38]
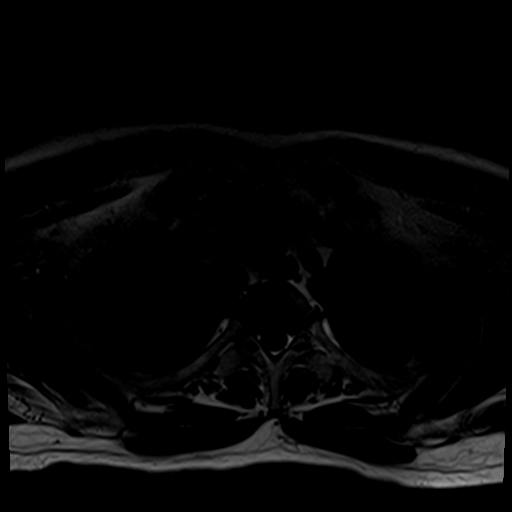
[im 25/38]
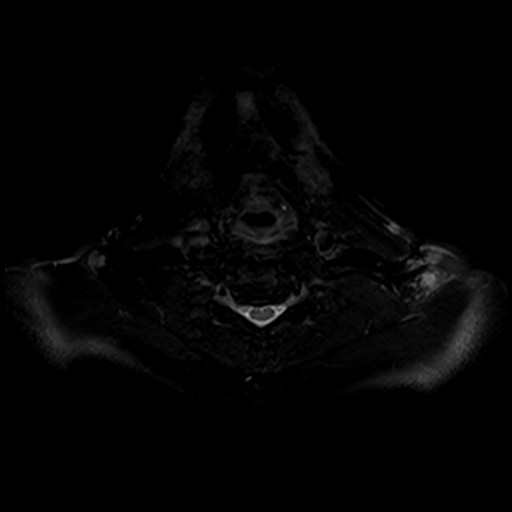
[im 38/38]
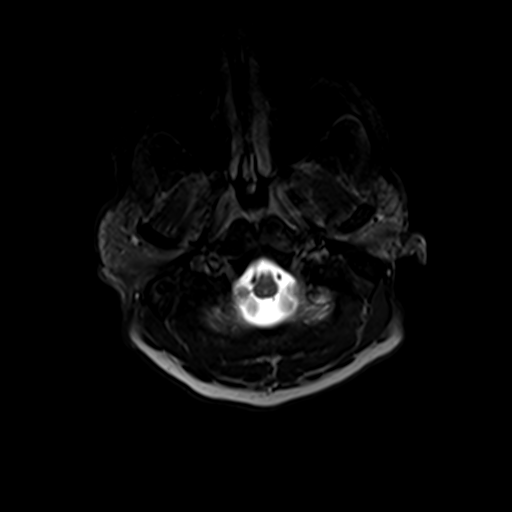

[19 of 48 positions shown; findings below may reference images not displayed]

FINDINGS: Pharynx and larynx: Negative

Salivary glands: Negative for mass or edema.

Thyroid: Negative

Lymph nodes: No enlarged lymph nodes in the neck.

Vascular: Normal vascular signal

Limited intracranial: Not imaged

Visualized orbits: Not imaged

Mastoids and visualized paranasal sinuses: Minimal mucosal edema
paranasal sinuses.

Skeleton: Multilevel cervical disc degeneration and spurring. There
is significant spinal stenosis at C3-4 with cord hyperintensity
consistent with compressive myelopathy.

Upper chest: Negative

Other: Vitamin-E capsule is present overlying the right anterior
neck. No underlying mass or adenopathy. There is advanced
degenerative change in the right sternoclavicular joint with bony
overgrowth at this level which likely is the palpable abnormality.
IMPRESSION: Negative for mass or adenopathy in neck. Palpable abnormality
appears to represent bony hypertrophy of the right sternoclavicular
joint related to osteoarthritis.

Multilevel cervical spondylosis. There is moderate spinal stenosis
with cord hyperintensity at C3-4.
